# Patient Record
Sex: Male | Born: 1965 | Race: White | Hispanic: No | Marital: Single | Smoking: Former smoker
Health system: Southern US, Community
[De-identification: ages and names within clinical notes are randomized; demographics above are authoritative.]

## PROBLEM LIST (undated history)

## (undated) DIAGNOSIS — R0683 Snoring: Secondary | ICD-10-CM

## (undated) DIAGNOSIS — F40240 Claustrophobia: Secondary | ICD-10-CM

## (undated) DIAGNOSIS — D649 Anemia, unspecified: Secondary | ICD-10-CM

## (undated) DIAGNOSIS — I10 Essential (primary) hypertension: Secondary | ICD-10-CM

## (undated) DIAGNOSIS — Z87442 Personal history of urinary calculi: Secondary | ICD-10-CM

## (undated) DIAGNOSIS — I499 Cardiac arrhythmia, unspecified: Secondary | ICD-10-CM

## (undated) DIAGNOSIS — F410 Panic disorder [episodic paroxysmal anxiety] without agoraphobia: Secondary | ICD-10-CM

## (undated) DIAGNOSIS — T8859XA Other complications of anesthesia, initial encounter: Secondary | ICD-10-CM

## (undated) DIAGNOSIS — I480 Paroxysmal atrial fibrillation: Secondary | ICD-10-CM

## (undated) DIAGNOSIS — K219 Gastro-esophageal reflux disease without esophagitis: Secondary | ICD-10-CM

## (undated) DIAGNOSIS — R0789 Other chest pain: Secondary | ICD-10-CM

## (undated) HISTORY — DX: Snoring: R06.83

## (undated) HISTORY — PX: ESOPHAGOGASTRODUODENOSCOPY: SHX1529

## (undated) HISTORY — DX: Other chest pain: R07.89

## (undated) HISTORY — DX: Morbid (severe) obesity due to excess calories: E66.01

## (undated) HISTORY — DX: Paroxysmal atrial fibrillation: I48.0

---

## 1994-06-27 HISTORY — PX: NASAL SINUS SURGERY: SHX719

## 1996-06-27 HISTORY — PX: KNEE ARTHROSCOPY: SHX127

## 2008-09-08 ENCOUNTER — Ambulatory Visit (HOSPITAL_BASED_OUTPATIENT_CLINIC_OR_DEPARTMENT_OTHER): Admission: RE | Admit: 2008-09-08 | Discharge: 2008-09-08 | Payer: Self-pay | Admitting: Family Medicine

## 2008-09-13 ENCOUNTER — Ambulatory Visit: Payer: Self-pay | Admitting: Internal Medicine

## 2010-11-09 NOTE — Procedures (Signed)
NAME:  Nathaniel Mcgee, Nathaniel Mcgee NO.:  [id number]   MEDICAL RECORD NO.:  [id number]         PATIENT TYPE:  OUT   LOCATION:  SLEEP CENTER                 FACILITY:  [facility]   PHYSICIAN:  Clinton D. Maple Hudson, MD, FCCP, FACPDATE OF BIRTH:  07/13/67   DATE OF STUDY:  09/08/2008                            NOCTURNAL POLYSOMNOGRAM   REFERRING PHYSICIAN:  Jeanmarie Plant   INDICATIONS FOR STUDY:  Hypersomnia with sleep apnea.   EPWORTH SLEEPINESS SCORE:  Epworth sleepiness score 9/24.  BMI 41.1.  Weight 347 pounds.  Height 77 inches.  Neck is 19 inches.   MEDICATIONS:  Home medications are charted and reviewed.  Note, that he  describes a lifestyle with alternating shifts of work and sleep changing  within a week.  At times, he had difficulty initiating sleep and then  difficulty waking in the morning or at the end of his sleep.   SLEEP ARCHITECTURE:  Total sleep time 0.  Sleep efficiency 0.  The study  recorded at lights out at 2204 p.m. with lights on at 0:49:40 a.m.  No  sleep was obtained during that time.  The technician indicated that the  patient could not tolerate any of the routine monitor leads and  requested to end the study due to a panic attack.   IMPRESSION/RECOMMENDATIONS:  1. This patient could not tolerate any of the routine monitor leads      and ended the study with no sleep obtained after what the      technician described as a panic attack with no bedtime medication      taken.  2. No sleep was achieved.  Baseline awake room air oxygen saturation      was 97%.  3. He describes a difficult work week with a rotating shift schedule,      which is likely to create sleep difficulties for otherwise normal      people.  If desired, he can return for repeat testing bringing a      sedative hypnotic if appropriate.  Arrangements can be made for his      to come over to the sleep center and spend time with the technician      desensitizing to the monitoring environment  separately from his      sleep study.  This arrangement could be made by the daytime Sleep      Center staff.  Additionally, we can arrange for a daytime sleep      study if that would be easier and better related to his best sleep      time.      Clinton D. Maple Hudson, MD, [facility], FACP  Diplomate, Biomedical engineer of Sleep Medicine  Electronically Signed     CDY/MEDQ  D:  09/13/2008 10:03:36  T:  09/14/2008 02:58:44  Job:  [id number]

## 2011-02-03 ENCOUNTER — Other Ambulatory Visit: Payer: Self-pay | Admitting: Family Medicine

## 2011-02-03 DIAGNOSIS — R29898 Other symptoms and signs involving the musculoskeletal system: Secondary | ICD-10-CM

## 2011-02-06 ENCOUNTER — Ambulatory Visit
Admission: RE | Admit: 2011-02-06 | Discharge: 2011-02-06 | Disposition: A | Discharge status: Home / Self Care | Payer: 59 | Source: Ambulatory Visit | Attending: Family Medicine | Admitting: Family Medicine

## 2011-02-06 DIAGNOSIS — R29898 Other symptoms and signs involving the musculoskeletal system: Secondary | ICD-10-CM

## 2012-04-03 ENCOUNTER — Ambulatory Visit (INDEPENDENT_AMBULATORY_CARE_PROVIDER_SITE_OTHER): Payer: 59 | Admitting: Licensed Clinical Social Worker

## 2012-04-03 DIAGNOSIS — F411 Generalized anxiety disorder: Secondary | ICD-10-CM

## 2012-05-01 ENCOUNTER — Ambulatory Visit (INDEPENDENT_AMBULATORY_CARE_PROVIDER_SITE_OTHER): Payer: 59 | Admitting: Licensed Clinical Social Worker

## 2012-05-01 DIAGNOSIS — F411 Generalized anxiety disorder: Secondary | ICD-10-CM

## 2012-05-29 ENCOUNTER — Ambulatory Visit (INDEPENDENT_AMBULATORY_CARE_PROVIDER_SITE_OTHER): Payer: 59 | Admitting: Licensed Clinical Social Worker

## 2012-05-29 DIAGNOSIS — F411 Generalized anxiety disorder: Secondary | ICD-10-CM

## 2012-07-03 ENCOUNTER — Ambulatory Visit (INDEPENDENT_AMBULATORY_CARE_PROVIDER_SITE_OTHER): Payer: 59 | Admitting: Licensed Clinical Social Worker

## 2012-07-03 DIAGNOSIS — F411 Generalized anxiety disorder: Secondary | ICD-10-CM

## 2012-07-31 ENCOUNTER — Ambulatory Visit (INDEPENDENT_AMBULATORY_CARE_PROVIDER_SITE_OTHER): Payer: 59 | Admitting: Licensed Clinical Social Worker

## 2012-07-31 DIAGNOSIS — F411 Generalized anxiety disorder: Secondary | ICD-10-CM

## 2012-09-25 ENCOUNTER — Ambulatory Visit (INDEPENDENT_AMBULATORY_CARE_PROVIDER_SITE_OTHER): Payer: 59 | Admitting: Licensed Clinical Social Worker

## 2012-09-25 DIAGNOSIS — F411 Generalized anxiety disorder: Secondary | ICD-10-CM

## 2013-01-29 ENCOUNTER — Ambulatory Visit (INDEPENDENT_AMBULATORY_CARE_PROVIDER_SITE_OTHER): Payer: 59 | Admitting: Licensed Clinical Social Worker

## 2013-01-29 DIAGNOSIS — F411 Generalized anxiety disorder: Secondary | ICD-10-CM

## 2013-05-28 ENCOUNTER — Ambulatory Visit: Payer: 59 | Admitting: Licensed Clinical Social Worker

## 2016-04-01 ENCOUNTER — Encounter: Payer: Self-pay | Admitting: Emergency Medicine

## 2016-04-01 ENCOUNTER — Emergency Department: Payer: 59

## 2016-04-01 ENCOUNTER — Observation Stay
Admission: EM | Admit: 2016-04-01 | Discharge: 2016-04-02 | Disposition: A | Discharge status: Home / Self Care | Payer: 59 | Attending: Internal Medicine | Admitting: Internal Medicine

## 2016-04-01 DIAGNOSIS — Z7982 Long term (current) use of aspirin: Secondary | ICD-10-CM | POA: Insufficient documentation

## 2016-04-01 DIAGNOSIS — Z79899 Other long term (current) drug therapy: Secondary | ICD-10-CM | POA: Insufficient documentation

## 2016-04-01 DIAGNOSIS — D72829 Elevated white blood cell count, unspecified: Secondary | ICD-10-CM | POA: Insufficient documentation

## 2016-04-01 DIAGNOSIS — G8929 Other chronic pain: Secondary | ICD-10-CM | POA: Diagnosis not present

## 2016-04-01 DIAGNOSIS — I4891 Unspecified atrial fibrillation: Principal | ICD-10-CM | POA: Insufficient documentation

## 2016-04-01 DIAGNOSIS — R079 Chest pain, unspecified: Secondary | ICD-10-CM | POA: Diagnosis present

## 2016-04-01 DIAGNOSIS — I480 Paroxysmal atrial fibrillation: Secondary | ICD-10-CM

## 2016-04-01 DIAGNOSIS — I10 Essential (primary) hypertension: Secondary | ICD-10-CM | POA: Diagnosis not present

## 2016-04-01 DIAGNOSIS — M542 Cervicalgia: Secondary | ICD-10-CM | POA: Insufficient documentation

## 2016-04-01 DIAGNOSIS — T380X5A Adverse effect of glucocorticoids and synthetic analogues, initial encounter: Secondary | ICD-10-CM | POA: Diagnosis not present

## 2016-04-01 HISTORY — DX: Claustrophobia: F40.240

## 2016-04-01 HISTORY — DX: Panic disorder (episodic paroxysmal anxiety): F41.0

## 2016-04-01 HISTORY — DX: Essential (primary) hypertension: I10

## 2016-04-01 HISTORY — DX: Paroxysmal atrial fibrillation: I48.0

## 2016-04-01 HISTORY — DX: Gastro-esophageal reflux disease without esophagitis: K21.9

## 2016-04-01 LAB — CBC WITH DIFFERENTIAL/PLATELET
Basophils Absolute: 0.1 10*3/uL (ref 0–0.1)
Basophils Relative: 0 %
EOS ABS: 0.1 10*3/uL (ref 0–0.7)
EOS PCT: 1 %
HCT: 50.7 % (ref 40.0–52.0)
HEMOGLOBIN: 16.8 g/dL (ref 13.0–18.0)
Lymphocytes Relative: 20 %
Lymphs Abs: 4.8 10*3/uL — ABNORMAL HIGH (ref 1.0–3.6)
MCH: 28 pg (ref 26.0–34.0)
MCHC: 33.1 g/dL (ref 32.0–36.0)
MCV: 84.5 fL (ref 80.0–100.0)
MONO ABS: 2 10*3/uL — AB (ref 0.2–1.0)
MONOS PCT: 8 %
NEUTROS PCT: 71 %
Neutro Abs: 17.4 10*3/uL — ABNORMAL HIGH (ref 1.4–6.5)
Platelets: 327 10*3/uL (ref 150–440)
RBC: 6 MIL/uL — ABNORMAL HIGH (ref 4.40–5.90)
RDW: 15.1 % — AB (ref 11.5–14.5)
WBC: 24.4 10*3/uL — ABNORMAL HIGH (ref 3.8–10.6)

## 2016-04-01 LAB — PROTIME-INR
INR: 0.97
PROTHROMBIN TIME: 12.9 s (ref 11.4–15.2)

## 2016-04-01 LAB — COMPREHENSIVE METABOLIC PANEL
ALK PHOS: 72 U/L (ref 38–126)
ALT: 50 U/L (ref 17–63)
ANION GAP: 7 (ref 5–15)
AST: 30 U/L (ref 15–41)
Albumin: 4.2 g/dL (ref 3.5–5.0)
BUN: 14 mg/dL (ref 6–20)
CALCIUM: 9.5 mg/dL (ref 8.9–10.3)
CO2: 24 mmol/L (ref 22–32)
Chloride: 107 mmol/L (ref 101–111)
Creatinine, Ser: 0.91 mg/dL (ref 0.61–1.24)
GFR calc non Af Amer: >60 mL/min (ref >60)
Glucose, Bld: 120 mg/dL — ABNORMAL HIGH (ref 65–99)
POTASSIUM: 3.8 mmol/L (ref 3.5–5.1)
SODIUM: 138 mmol/L (ref 135–145)
TOTAL PROTEIN: 7.6 g/dL (ref 6.5–8.1)
Total Bilirubin: 0.7 mg/dL (ref 0.3–1.2)

## 2016-04-01 LAB — TSH: TSH: 1.859 u[IU]/mL (ref 0.350–4.500)

## 2016-04-01 LAB — APTT: aPTT: 32 seconds (ref 24–36)

## 2016-04-01 LAB — TROPONIN I
Troponin I: <0.03 ng/mL (ref <0.03)
Troponin I: <0.03 ng/mL (ref <0.03)

## 2016-04-01 LAB — T4, FREE: FREE T4: 0.98 ng/dL (ref 0.61–1.12)

## 2016-04-01 MED ORDER — NICOTINE 14 MG/24HR TD PT24
14.0000 mg | MEDICATED_PATCH | Freq: Every day | TRANSDERMAL | Status: DC
  Administered 2016-04-01: 14 mg via TRANSDERMAL
  Filled 2016-04-01: qty 1

## 2016-04-01 MED ORDER — ACETAMINOPHEN 325 MG PO TABS: 650.0000 mg | ORAL_TABLET | Freq: Four times a day (QID) | ORAL | Status: DC | PRN

## 2016-04-01 MED ORDER — SODIUM CHLORIDE 0.9 % IV SOLN: 250.0000 mL | INTRAVENOUS | Status: DC | PRN

## 2016-04-01 MED ORDER — SODIUM CHLORIDE 0.9% FLUSH
3.0000 mL | Freq: Two times a day (BID) | INTRAVENOUS | Status: DC
  Administered 2016-04-01 – 2016-04-02 (×2): 3 mL via INTRAVENOUS

## 2016-04-01 MED ORDER — ACETAMINOPHEN 650 MG RE SUPP: 650.0000 mg | Freq: Four times a day (QID) | RECTAL | Status: DC | PRN

## 2016-04-01 MED ORDER — DILTIAZEM HCL 25 MG/5ML IV SOLN
INTRAVENOUS | Status: AC
  Administered 2016-04-01: 15 mg via INTRAVENOUS
  Filled 2016-04-01: qty 5

## 2016-04-01 MED ORDER — ALBUTEROL SULFATE (2.5 MG/3ML) 0.083% IN NEBU: 2.5000 mg | INHALATION_SOLUTION | RESPIRATORY_TRACT | Status: DC | PRN

## 2016-04-01 MED ORDER — ENOXAPARIN SODIUM 40 MG/0.4ML ~~LOC~~ SOLN
40.0000 mg | SUBCUTANEOUS | Status: DC
  Administered 2016-04-01: 40 mg via SUBCUTANEOUS

## 2016-04-01 MED ORDER — DILTIAZEM HCL 30 MG PO TABS
30.0000 mg | ORAL_TABLET | Freq: Once | ORAL | Status: AC
  Administered 2016-04-01: 30 mg via ORAL
  Filled 2016-04-01: qty 1

## 2016-04-01 MED ORDER — SODIUM CHLORIDE 0.9% FLUSH: 3.0000 mL | INTRAVENOUS | Status: DC | PRN

## 2016-04-01 MED ORDER — IBUPROFEN 400 MG PO TABS
400.0000 mg | ORAL_TABLET | Freq: Four times a day (QID) | ORAL | Status: DC | PRN
Start: 2016-04-01 — End: 2016-04-02
  Administered 2016-04-02: 400 mg via ORAL
  Filled 2016-04-01: qty 1

## 2016-04-01 MED ORDER — SODIUM CHLORIDE 0.9 % IV BOLUS (SEPSIS)
1000.0000 mL | Freq: Once | INTRAVENOUS | Status: AC
  Administered 2016-04-01: 1000 mL via INTRAVENOUS

## 2016-04-01 MED ORDER — DILTIAZEM HCL 25 MG/5ML IV SOLN
5.0000 mg | Freq: Once | INTRAVENOUS | Status: AC
  Administered 2016-04-01: 5 mg via INTRAVENOUS

## 2016-04-01 MED ORDER — APIXABAN 5 MG PO TABS
5.0000 mg | ORAL_TABLET | Freq: Two times a day (BID) | ORAL | Status: DC
  Administered 2016-04-02: 5 mg via ORAL
  Filled 2016-04-01: qty 1

## 2016-04-01 MED ORDER — ADENOSINE 12 MG/4ML IV SOLN
INTRAVENOUS | Status: AC
  Filled 2016-04-01: qty 4

## 2016-04-01 MED ORDER — METOPROLOL TARTRATE 5 MG/5ML IV SOLN: 5.0000 mg | INTRAVENOUS | Status: DC | PRN

## 2016-04-01 MED ORDER — METOPROLOL TARTRATE 50 MG PO TABS
50.0000 mg | ORAL_TABLET | Freq: Two times a day (BID) | ORAL | Status: DC
  Administered 2016-04-01 – 2016-04-02 (×2): 50 mg via ORAL
  Filled 2016-04-01: qty 1

## 2016-04-01 MED ORDER — DILTIAZEM HCL 25 MG/5ML IV SOLN
15.0000 mg | Freq: Once | INTRAVENOUS | Status: AC
  Administered 2016-04-01: 15 mg via INTRAVENOUS

## 2016-04-01 MED ORDER — ADENOSINE 6 MG/2ML IV SOLN
INTRAVENOUS | Status: AC
  Filled 2016-04-01: qty 2

## 2016-04-01 NOTE — ED Notes (Signed)
Pt denies needs, explanation of admission process explained to pt who verbalizes understanding. Call bell provided. Mother at bedside.

## 2016-04-01 NOTE — Progress Notes (Signed)
ANTICOAGULATION CONSULT NOTE - Initial Consult  Pharmacy Consult for Apixaban  Indication: atrial fibrillation  Allergies  Allergen Reactions  . Sulfa Antibiotics Other (See Comments)    "feels like my bones were burning"    Patient Measurements: Height: 6\' 5"  (195.6 cm) Weight: (!) 359 lb (162.8 kg) IBW/kg (Calculated) : 89.1 Heparin Dosing Weight:   Vital Signs: BP: 115/79 (10/06 2018) Pulse Rate: 97 (10/06 2018)  Labs:  Recent Labs  04/01/16 1729  HGB 16.8  HCT 50.7  PLT 327  APTT 32  LABPROT 12.9  INR 0.97  CREATININE 0.91  TROPONINI <0.03    Estimated Creatinine Clearance: 166.5 mL/min (by C-G formula based on SCr of 0.91 mg/dL).   Medical History: Past Medical History:  Diagnosis Date  . Claustrophobia   . GERD (gastroesophageal reflux disease)   . Hypertension   . Panic attacks     Medications:   (Not in a hospital admission)  Assessment: Pharmacy consulted to dose Eliquis in this 50 year old male with Afib. TBW = 162.8 kg  SrCr = 0.98  Goal of Therapy:  prevention of thrombo embolism   Plan:  Pt received Lovenox 40 mg SQ on 10/06 @ 20:00.  Eliquis 5 mg PO BID ordered to start on 10/07 @ 10:00.   Mckynzi Cammon D 04/01/2016,8:41 PM

## 2016-04-01 NOTE — Progress Notes (Signed)
Patient is admitted to room 238 with the diagnosis of Afib. Alert and oriented x 4. Denied any acute chest pain right now. Tele box called to CCMD ( box 04 ) with Andria Meuse RN as a second  verified. Skin assessment done with Andria Meuse RN as well, no skin issues of concern ( intact skin) noted. Mother is at the bedside voiced no concerns. Patient requested for Nicotine patch. Dr. Jannifer Franklin notified and received new order and will be implemented as soon as it becomes available. Will continue to monitor.

## 2016-04-01 NOTE — ED Notes (Signed)
Pt updated on admission process and room assignment. Pt verbalizes understanding.

## 2016-04-01 NOTE — ED Notes (Signed)
Mother's number [phone]

## 2016-04-01 NOTE — H&P (Signed)
[place] [facility] [place] NAME: Nathaniel Mcgee    MR#:  [id number]  DATE OF BIRTH:  11-Aug-1965  DATE OF ADMISSION:  04/01/2016  PRIMARY CARE PHYSICIAN: No primary care provider on file.   REQUESTING/REFERRING PHYSICIAN: Dr. Edd Fabian  CHIEF COMPLAINT:   Chief Complaint  Patient presents with  . Chest Pain    HISTORY OF PRESENT ILLNESS:  Nathaniel Mcgee  is a 50 y.o. male with a known history of Back pain, neck pain presents to the emergency room complaining of chest pain. He has been found to be in atrial fibrillation with rapid ventricular rate. Has been on prednisone for back pain and white blood cell count is elevated at 24.5 thousand. Patient has chronic neck pain radiating to chest thought to be due to degenerative disc disease of the neck. He has had recurrent ER visits for the same. Today he presented with similar pain but was found to be in atrial fibrillation. He has received 2 doses of IV Cardizem and oral Cardizem with heart rate still 110-125. He is being admitted to the hospitalist service.  PAST MEDICAL HISTORY:   Past Medical History:  Diagnosis Date  . Claustrophobia   . GERD (gastroesophageal reflux disease)   . Hypertension   . Panic attacks     PAST SURGICAL HISTORY:  No past surgical history on file.  SOCIAL HISTORY:   Social History  Substance Use Topics  . Smoking status: Not on file  . Smokeless tobacco: Not on file  . Alcohol use Not on file    FAMILY HISTORY:  No family history on file.  DRUG ALLERGIES:   Allergies  Allergen Reactions  . Sulfa Antibiotics Other (See Comments)    "feels like my bones were burning"    REVIEW OF SYSTEMS:   Review of Systems  Constitutional: Positive for malaise/fatigue. Negative for chills, fever and weight loss.  HENT: Negative for hearing loss and nosebleeds.   Eyes: Negative for blurred vision, double vision and pain.  Respiratory: Negative for cough, hemoptysis,  sputum production, shortness of breath and wheezing.   Cardiovascular: Positive for palpitations. Negative for chest pain, orthopnea and leg swelling.  Gastrointestinal: Negative for abdominal pain, constipation, diarrhea, nausea and vomiting.  Genitourinary: Negative for dysuria and hematuria.  Musculoskeletal: Positive for back pain and neck pain. Negative for falls and myalgias.  Skin: Negative for rash.  Neurological: Negative for dizziness, tremors, sensory change, speech change, focal weakness, seizures and headaches.  Endo/Heme/Allergies: Does not bruise/bleed easily.  Psychiatric/Behavioral: Negative for depression and memory loss. The patient is not nervous/anxious.     MEDICATIONS AT HOME:   Prior to Admission medications   Medication Sig Start Date End Date Taking? Authorizing Provider  aspirin EC 81 MG tablet Take 324 mg by mouth daily.   Yes Historical Provider, MD  chlorzoxazone (PARAFON) 500 MG tablet Take 1 tablet by mouth 3 (three) times daily. 03/25/16  Yes Historical Provider, MD  Multiple Vitamin (MULTIVITAMIN) tablet Take 1 tablet by mouth daily.   Yes Historical Provider, MD  predniSONE (STERAPRED UNI-PAK 21 TAB) 10 MG (21) TBPK tablet Take 10-60 mg by mouth daily. 6,5,4,3,2,1 Finished today 04/01/2016     Historical Provider, MD     VITAL SIGNS:  Blood pressure (!) 134/98, pulse 100, resp. rate 20, height 6\' 5"  (1.956 m), weight (!) 162.8 kg (359 lb), SpO2 94 %.  PHYSICAL EXAMINATION:  Physical Exam  GENERAL:  50 y.o.-year-old patient lying in  the bed with no acute distress. Obese EYES: Pupils equal, round, reactive to light and accommodation. No scleral icterus. Extraocular muscles intact.  HEENT: Head atraumatic, normocephalic. Oropharynx and nasopharynx clear. No oropharyngeal erythema, moist oral mucosa  NECK:  Supple, no jugular venous distention. No thyroid enlargement, no tenderness.  LUNGS: Normal breath sounds bilaterally, no wheezing, rales, rhonchi. No  use of accessory muscles of respiration.  CARDIOVASCULAR: S1, S2 irregular and tachycardic. No murmurs, rubs, or gallops.  ABDOMEN: Soft, nontender, nondistended. Bowel sounds present. No organomegaly or mass.  EXTREMITIES: No pedal edema, cyanosis, or clubbing. + 2 pedal & radial pulses b/l.   NEUROLOGIC: Cranial nerves II through XII are intact. No focal Motor or sensory deficits appreciated b/l PSYCHIATRIC: The patient is alert and oriented x 3. Good affect.  SKIN: No obvious rash, lesion, or ulcer.   LABORATORY PANEL:   CBC  Recent Labs Lab 04/01/16 1729  WBC 24.4*  HGB 16.8  HCT 50.7  PLT 327   ------------------------------------------------------------------------------------------------------------------  Chemistries   Recent Labs Lab 04/01/16 1729  NA 138  K 3.8  CL 107  CO2 24  GLUCOSE 120*  BUN 14  CREATININE 0.91  CALCIUM 9.5  AST 30  ALT 50  ALKPHOS 72  BILITOT 0.7   ------------------------------------------------------------------------------------------------------------------  Cardiac Enzymes  Recent Labs Lab 04/01/16 1729  TROPONINI <0.03   ------------------------------------------------------------------------------------------------------------------  RADIOLOGY:  Dg Chest Portable 1 View  Result Date: 04/01/2016 CLINICAL DATA:  Chest pain 1 week ago.  Dizziness and confusion. EXAM: PORTABLE CHEST 1 VIEW COMPARISON:  None. FINDINGS: The heart size and mediastinal contours are within normal limits. Both lungs are clear. The visualized skeletal structures are unremarkable. IMPRESSION: No active disease. Electronically Signed   By: Lucienne Capers M.D.   On: 04/01/2016 18:09     IMPRESSION AND PLAN:   *  Atrial fibrillation with rapid ventricular rate Heart rate is still close to 120 after IV and oral Cardizem. In atrial fibrillation. Start oral metoprolol. IV metoprolol as needed. Start Eliquis. Check echocardiogram. Consult  cardiology. TSH normal.  * Chronic neck pain is same. Ibuprofen as needed.  * Leukocytosis due to recent prednisone use    All the records are reviewed and case discussed with ED provider. Management plans discussed with the patient, family and they are in agreement.  CODE STATUS: FULL CODE  TOTAL TIME TAKING CARE OF THIS PATIENT: 40 minutes.   Hillary Bow R M.D on 04/01/2016 at 7:50 PM  Between 7am to 6pm - Pager - [phone]  After 6pm go to [email or web address] - password [email or web address] [facility]  [place] [facility]  Office  [phone]  CC: Primary care physician; No primary care provider on file.  Note: This dictation was prepared with Dragon dictation along with smaller phrase technology. Any transcriptional errors that result from this process are unintentional.

## 2016-04-01 NOTE — ED Triage Notes (Signed)
Patient states that he started having chest pain one week ago.  Palpitations began last night. Patient thought it was his esophagus due to history of GERD.  Takes pepcid.  Patient feeling dizzy, confused, diaphoretic, and heart palpitations.

## 2016-04-01 NOTE — ED Notes (Signed)
Informed Rn bed ready

## 2016-04-01 NOTE — ED Provider Notes (Signed)
[facility] Emergency Department Provider Note   ____________________________________________   First MD Initiated Contact with Patient 04/01/16 1732     (approximate)  I have reviewed the triage vital signs and the nursing notes.   HISTORY  Chief Complaint Chest Pain    HPI Nathaniel Mcgee is a 50 y.o. male history anxiety, panic attacks, GERD, hypertension, scleroderma who presents from urgent care for chest pain as well as elevated heart rate. Patient reports that he has had intermittent chest pain over the past week, usually gradual onset, sometimes radiating into the elbows bilaterally, usually moderate, now resolved. He reports that the last time he had this chest pain was one week ago. He has had intermittent palpitations where he feels as if his heart is beating quite quickly. Had mild shortness of breath intermittently. No vomiting, diarrhea, fevers or chills. He was found to have an elevated heart rate in the 160s at urgent care and was referred to the emergency department.   Past Medical History:  Diagnosis Date  . Claustrophobia   . GERD (gastroesophageal reflux disease)   . Hypertension   . Panic attacks     There are no active problems to display for this patient.   No past surgical history on file.  Prior to Admission medications   Not on File    Allergies Review of patient's allergies indicates not on file.  No family history on file.  Social History Social History  Substance Use Topics  . Smoking status: Not on file  . Smokeless tobacco: Not on file  . Alcohol use Not on file    Review of Systems Constitutional: No fever/chills Eyes: No visual changes. ENT: No sore throat. Cardiovascular: + chest pain. Respiratory: Denies shortness of breath. Gastrointestinal: No abdominal pain.  No nausea, no vomiting.  No diarrhea.  No constipation. Genitourinary: Negative for dysuria. Musculoskeletal: Negative for back  pain. Skin: Negative for rash. Neurological: Negative for headaches, focal weakness or numbness.  10-point ROS otherwise negative.  ____________________________________________   PHYSICAL EXAM:  Vitals:   04/01/16 1727 04/01/16 1730 04/01/16 1800 04/01/16 1815  BP:  117/83 113/79 (!) 131/92  Pulse:  69 99 (!) 101  Resp:  16 20 (!) 30  SpO2:  95% 97% 95%  Weight: (!) 359 lb (162.8 kg)     Height:        VITAL SIGNS: ED Triage Vitals  Enc Vitals Group     BP 04/01/16 1724 (!) 147/78     Pulse Rate 04/01/16 1724 82     Resp 04/01/16 1724 16     Temp --      Temp src --      SpO2 04/01/16 1724 98 %     Weight 04/01/16 1721 255 lb (115.7 kg)     Height 04/01/16 1721 6\' 5"  (1.956 m)     Head Circumference --      Peak Flow --      Pain Score 04/01/16 1721 4     Pain Loc --      Pain Edu? --      Excl. in [place]? --     Constitutional: Alert and oriented. Well appearing and in no acute distress. Eyes: Conjunctivae are normal. PERRL. EOMI. Head: Atraumatic. Nose: No congestion/rhinnorhea. Mouth/Throat: Mucous membranes are moist.  Oropharynx non-erythematous. Neck: No stridor.   Cardiovascular: Tachycardic rate, irregular rhythm. Grossly normal heart sounds.  Good peripheral circulation. Respiratory: Normal respiratory effort.  No retractions. Lungs  CTAB. Gastrointestinal: Soft and nontender. No distention. No CVA tenderness. Genitourinary: deferred Musculoskeletal: No lower extremity tenderness nor edema.  No joint effusions. Neurologic:  Normal speech and language. No gross focal neurologic deficits are appreciated. No gait instability. Skin:  Skin is warm, dry and intact. No rash noted. Psychiatric: Mood and affect are normal. Speech and behavior are normal.  ____________________________________________   LABS (all labs ordered are listed, but only abnormal results are displayed)  Labs Reviewed  CBC WITH DIFFERENTIAL/PLATELET - Abnormal; Notable for the following:        Result Value   WBC 24.4 (*)    RBC 6.00 (*)    RDW 15.1 (*)    Neutro Abs 17.4 (*)    Lymphs Abs 4.8 (*)    Monocytes Absolute 2.0 (*)    All other components within normal limits  COMPREHENSIVE METABOLIC PANEL - Abnormal; Notable for the following:    Glucose, Bld 120 (*)    All other components within normal limits  TROPONIN I  PROTIME-INR  APTT  TSH  T4, FREE   ____________________________________________  EKG  ED ECG REPORT I, Joanne Gavel, the attending physician, personally viewed and interpreted this ECG.   Date: 04/01/2016  EKG Time: 17:23  Rate: 165  Rhythm: atrial fibrillation, rate 165  Axis: normal  Intervals:none  ST&T Change: No acute ST elevation or acute ST depression.  ____________________________________________  RADIOLOGY  CXR IMPRESSION:  No active disease.      ____________________________________________   PROCEDURES  Procedure(s) performed: None  Procedures  Critical Care performed: Yes, see critical care note(s).  CRITICAL CARE Performed by: Loura Pardon A   Total critical care time: 35 minutes  Critical care time was exclusive of separately billable procedures and treating other patients.  Critical care was necessary to treat or prevent imminent or life-threatening deterioration.  Critical care was time spent personally by me on the following activities: development of treatment plan with patient and/or surrogate as well as nursing, discussions with consultants, evaluation of patient's response to treatment, examination of patient, obtaining history from patient or surrogate, ordering and performing treatments and interventions, ordering and review of laboratory studies, ordering and review of radiographic studies, pulse oximetry and re-evaluation of patient's condition.  ____________________________________________   INITIAL IMPRESSION / ASSESSMENT AND PLAN / ED COURSE  Pertinent labs & imaging results that were  available during my care of the patient were reviewed by me and considered in my medical decision making (see chart for details).  Nathaniel Mcgee is a 50 y.o. male history anxiety, panic attacks, GERD, hypertension, scleroderma who presents from urgent care for chest pain as well as elevated heart rate. Currently his chest pain is resolved. On arrival to the emergency department his initial heart rate was 165-190 bpm, EKG confirmed new-onset atrial fibrillation with a rapid ventricular rate. He received 15 mg of IV Cardizem with improvement of his heart rate to the 90s/low 100s. Will give 30 mg PO cardizem as well. He is maintaining adequate blood pressure. He is being observed on continuous telemetry monitor. We'll obtain screening labs, chest x-ray and this anticipate admission.  ----------------------------------------- 6:46 PM on 04/01/2016 ----------------------------------------- Heart rate currently 120 bpm, we'll give an additional 5 mg of IV Cardizem, patient continues to appear well. Labs are notable for leukocytosis, white blood count count is significantly elevated however patient denies any infectious complaints and I think that this may be catecholamine induced in the setting of his new A. fib with RVR.  His initial troponin is negative. CMP generally unremarkable. Case discussed with hospitalist for admission.  Clinical Course     ____________________________________________   FINAL CLINICAL IMPRESSION(S) / ED DIAGNOSES  Final diagnoses:  Chest pain, unspecified type  Atrial fibrillation with RVR (HCC)      NEW MEDICATIONS STARTED DURING THIS VISIT:  New Prescriptions   No medications on file     Note:  This document was prepared using Dragon voice recognition software and may include unintentional dictation errors.    Joanne Gavel, MD 04/01/16 [phone]

## 2016-04-01 NOTE — ED Notes (Signed)
Report from [place], [place].

## 2016-04-02 ENCOUNTER — Observation Stay (HOSPITAL_BASED_OUTPATIENT_CLINIC_OR_DEPARTMENT_OTHER)
Admit: 2016-04-02 | Discharge: 2016-04-02 | Disposition: A | Discharge status: Home / Self Care | Payer: 59 | Attending: Internal Medicine | Admitting: Internal Medicine

## 2016-04-02 DIAGNOSIS — I4891 Unspecified atrial fibrillation: Secondary | ICD-10-CM | POA: Diagnosis not present

## 2016-04-02 LAB — TROPONIN I

## 2016-04-02 LAB — ECHOCARDIOGRAM COMPLETE
HEIGHTINCHES: 77 in
WEIGHTICAEL: 5620.8 [oz_av]

## 2016-04-02 MED ORDER — APIXABAN 5 MG PO TABS: 5.0000 mg | ORAL_TABLET | Freq: Two times a day (BID) | ORAL | 1 refills | Status: DC

## 2016-04-02 MED ORDER — PERFLUTREN LIPID MICROSPHERE
1.0000 mL | INTRAVENOUS | Status: AC | PRN
  Filled 2016-04-02: qty 10

## 2016-04-02 MED ORDER — PERFLUTREN LIPID MICROSPHERE
1.0000 mL | INTRAVENOUS | Status: AC | PRN
  Administered 2016-04-02: 3 mL via INTRAVENOUS
  Filled 2016-04-02: qty 10

## 2016-04-02 MED ORDER — METOPROLOL TARTRATE 50 MG PO TABS: 50.0000 mg | ORAL_TABLET | Freq: Two times a day (BID) | ORAL | 1 refills | Status: DC

## 2016-04-02 NOTE — Progress Notes (Signed)
*  PRELIMINARY RESULTS* Echocardiogram 2D Echocardiogram has been performed. Definity Contrast used on this Echo.  Nathaniel Mcgee Stills 04/02/2016, 8:35 AM

## 2016-04-02 NOTE — Care Management Note (Signed)
Case Management Note  Patient Details  Name: Nathaniel Mcgee MRN: [id number] Date of Birth: [birth year]  Subjective/Objective:         [name] was provided with a discount coupon for Eliquis.            Action/Plan:   Expected Discharge Date:                  Expected Discharge Plan:     In-House Referral:     Discharge planning Services     Post Acute Care Choice:    Choice offered to:     DME Arranged:    DME Agency:     HH Arranged:    HH Agency:     Status of Service:     If discussed at [employer] of Stay Meetings, dates discussed:    Additional Comments:  Banks Chaikin A, RN 04/02/2016, 1:24 PM

## 2016-04-02 NOTE — Discharge Instructions (Signed)
Angina Pectoris  Angina pectoris, often called angina, is extreme discomfort in the chest, neck, or arm. This is caused by a lack of blood in the middle and thickest layer of the heart wall (myocardium). There are four types of angina:  · Stable angina. Stable angina usually occurs in episodes of predictable frequency and duration. It is usually brought on by physical activity, stress, or excitement. Stable angina usually lasts a few minutes and can often be relieved by a medicine that you place under your tongue. This medicine is called sublingual nitroglycerin.  · Unstable angina. Unstable angina can occur even when you are doing little or no physical activity. It can even occur while you are sleeping or when you are at rest. It can suddenly increase in severity or frequency. It may not be relieved by sublingual nitroglycerin, and it can last up to 30 minutes.  · Microvascular angina. This type of angina is caused by a disorder of tiny blood vessels called arterioles. Microvascular angina is more common in women. The pain may be more severe and last longer than other types of angina pectoris.  · Prinzmetal or variant angina. This type of angina pectoris is rare and usually occurs when you are doing little or no physical activity. It especially occurs in the early morning hours.  CAUSES  Atherosclerosis is the cause of angina. This is the buildup of fat and cholesterol (plaque) on the inside of the arteries. Over time, the plaque may narrow or block the artery, and this will lessen blood flow to the heart. Plaque can also become weak and break off within a coronary artery to form a clot and cause a sudden blockage.  RISK FACTORS  Risk factors common to both men and women include:  · High cholesterol levels.  · High blood pressure (hypertension).  · Tobacco use.  · Diabetes.  · Family history of angina.  · Obesity.  · Lack of exercise.  · A diet high in saturated fats.  Women are at greater risk for angina if they  are:  · Over age 55.  · Postmenopausal.  SYMPTOMS  Many people do not experience any symptoms during the early stages of angina. As the condition progresses, symptoms common to both men and women may include:  · Chest pain.    The pain can be described as a crushing or squeezing in the chest, or a tightness, pressure, fullness, or heaviness in the chest.    The pain can last more than a few minutes, or it can stop and recur.  · Pain in the arms, neck, jaw, or back.  · Unexplained heartburn or indigestion.  · Shortness of breath.  · Nausea.  · Sudden cold sweats.  · Sudden light-headedness.  Many women have chest discomfort and some of the other symptoms. However, women often have different (atypical) symptoms, such as:   · Fatigue.  · Unexplained feelings of nervousness or anxiety.  · Unexplained weakness.  · Dizziness or fainting.  Sometimes, women may have angina without any symptoms.  DIAGNOSIS   Tests to diagnose angina may include:  · ECG (electrocardiogram).  · Exercise stress test. This looks for signs of blockage when the heart is being exercised.  · Pharmacologic stress test. This test looks for signs of blockage when the heart is being stressed with a medicine.  · Blood tests.  · Coronary angiogram. This is a procedure to look at the coronary arteries to see if there is any blockage.    TREATMENT   The treatment of angina may include the following:  · Healthy behavioral changes to reduce or control risk factors.  · Medicine.  · Coronary stenting. A stent helps to keep an artery open.  · Coronary angioplasty. This procedure widens a narrowed or blocked artery.  · Coronary artery bypass surgery. This will allow your blood to pass the blockage (bypass) to reach your heart.  HOME CARE INSTRUCTIONS   · Take medicines only as directed by your health care provider.  · Do not take the following medicines unless your health care provider approves:    Nonsteroidal anti-inflammatory drugs (NSAIDs), such as ibuprofen,  naproxen, or celecoxib.    Vitamin supplements that contain vitamin A, vitamin E, or both.    Hormone replacement therapy that contains estrogen with or without progestin.  · Manage other health conditions such as hypertension and diabetes as directed by your health care provider.  · Follow a heart-healthy diet. A dietitian can help to educate you about healthy food options and changes.  · Use healthy cooking methods such as roasting, grilling, broiling, baking, poaching, steaming, or stir-frying. Talk to a dietitian to learn more about healthy cooking methods.  · Follow an exercise program approved by your health care provider.  · Maintain a healthy weight. Lose weight as approved by your health care provider.  · Plan rest periods when fatigued.  · Learn to manage stress.  · Do not use any tobacco products, including cigarettes, chewing tobacco, or electronic cigarettes. If you need help quitting, ask your health care provider.  · If you drink alcohol, and your health care provider approves, limit your alcohol intake to no more than 1 drink per day. One drink equals 12 ounces of beer, 5 ounces of wine, or 1½ ounces of hard liquor.  · Stop illegal drug use.  · Keep all follow-up visits as directed by your health care provider. This is important.  SEEK IMMEDIATE MEDICAL CARE IF:   · You have pain in your chest, neck, arm, jaw, stomach, or back that lasts more than a few minutes, is recurring, or is unrelieved by taking sublingual nitroglycerin.  · You have profuse sweating without cause.  · You have unexplained:    Heartburn or indigestion.    Shortness of breath or difficulty breathing.    Nausea or vomiting.    Fatigue.    Feelings of nervousness or anxiety.    Weakness.    Diarrhea.  · You have sudden light-headedness or dizziness.  · You faint.  These symptoms may represent a serious problem that is an emergency. Do not wait to see if the symptoms will go away. Get medical help right away. Call your local  emergency services (911 in the [place] Do not drive yourself to the hospital.     This information is not intended to replace advice given to you by your health care provider. Make sure you discuss any questions you have with your health care provider.     Document Released: 06/13/2005 Document Revised: 07/04/2014 Document Reviewed: 10/15/2013  Elsevier Interactive Patient Education ©2016 [employer]

## 2016-04-02 NOTE — Discharge Summary (Signed)
[facility] NAME: Nathaniel Mcgee    MR#:  [id number]  DATE OF BIRTH:  [birth year]  DATE OF ADMISSION:  04/01/2016 ADMITTING PHYSICIAN: Hillary Bow, MD  DATE OF DISCHARGE: 04/02/16  PRIMARY CARE PHYSICIAN: No primary care provider on file.    ADMISSION DIAGNOSIS:  Atrial fibrillation with RVR (HCC) [I48.91] Chest pain, unspecified type [R07.9]  DISCHARGE DIAGNOSIS:  New onset atrial afibrillation Chronic Back/neck pain  SECONDARY DIAGNOSIS:   Past Medical History:  Diagnosis Date  . Claustrophobia   . GERD (gastroesophageal reflux disease)   . Hypertension   . Panic attacks     HOSPITAL COURSE:  Nathaniel Mcgee  is a 50 y.o. male with a known history of Back pain, neck pain presents to the emergency room complaining of chest pain. He has been found to be in atrial fibrillation with rapid ventricular rate. Has been on prednisone for back pain and white blood cell count is elevated at 24.5 thousand  *Atrial fibrillation with rapid ventricular rate Heart rate was close to 120 after IV cardizem oral metoprolol Now on oral Eliquis. -echocardiogram done results pending Consult cardiology spoke with dr Johnsie Cancel. He is ok for pt to go home and he will arrange for out pt f/u. -I have sent message with pt's info via EPIC messaging TSH normal.  * Chronic neck pain is same. Ibuprofen as needed.  * Leukocytosis due to recent prednisone use  *suspected OSA Pt was not able to tolerate sleep study in the past.  Overall stable. D/c home  CONSULTS OBTAINED:  Treatment Team:  Josue Hector, MD  DRUG ALLERGIES:   Allergies  Allergen Reactions  . Sulfa Antibiotics Other (See Comments)    "feels like my bones were burning"    DISCHARGE MEDICATIONS:   Current Discharge Medication List    START taking these medications   Details  apixaban (ELIQUIS) 5 MG TABS tablet Take 1 tablet (5 mg total) by mouth 2 (two) times  daily. Qty: 60 tablet, Refills: 1    metoprolol (LOPRESSOR) 50 MG tablet Take 1 tablet (50 mg total) by mouth 2 (two) times daily. Qty: 60 tablet, Refills: 1      CONTINUE these medications which have NOT CHANGED   Details  aspirin EC 81 MG tablet Take 324 mg by mouth daily.    chlorzoxazone (PARAFON) 500 MG tablet Take 1 tablet by mouth 3 (three) times daily.    Multiple Vitamin (MULTIVITAMIN) tablet Take 1 tablet by mouth daily.    predniSONE (STERAPRED UNI-PAK 21 TAB) 10 MG (21) TBPK tablet Take 10-60 mg by mouth daily. 6,5,4,3,2,1 Finished today 04/01/2016         If you experience worsening of your admission symptoms, develop shortness of breath, life threatening emergency, suicidal or homicidal thoughts you must seek medical attention immediately by calling 911 or calling your MD immediately  if symptoms less severe.  You Must read complete instructions/literature along with all the possible adverse reactions/side effects for all the Medicines you take and that have been prescribed to you. Take any new Medicines after you have completely understood and accept all the possible adverse reactions/side effects.   Please note  You were cared for by a hospitalist during your hospital stay. If you have any questions about your discharge medications or the care you received while you were in the hospital after you are discharged, you can call the unit and asked to speak with the hospitalist  on call if the hospitalist that took care of you is not available. Once you are discharged, your primary care physician will handle any further medical issues. Please note that NO REFILLS for any discharge medications will be authorized once you are discharged, as it is imperative that you return to your primary care physician (or establish a relationship with a primary care physician if you do not have one) for your aftercare needs so that they can reassess your need for medications and monitor your lab  values. Today   SUBJECTIVE   I feel much better today  VITAL SIGNS:  Blood pressure 114/64, pulse 61, temperature 98.1 F (36.7 C), temperature source Oral, resp. rate (!) 22, height 6\' 5"  (1.956 m), weight (!) 159.3 kg (351 lb 4.8 oz), SpO2 95 %.  I/O:   Intake/Output Summary (Last 24 hours) at 04/02/16 1249 Last data filed at 04/02/16 1112  Gross per 24 hour  Intake             1450 ml  Output              700 ml  Net              750 ml    PHYSICAL EXAMINATION:  GENERAL:  49 y.o.-year-old patient lying in the bed with no acute distress.  EYES: Pupils equal, round, reactive to light and accommodation. No scleral icterus. Extraocular muscles intact.  HEENT: Head atraumatic, normocephalic. Oropharynx and nasopharynx clear.  NECK:  Supple, no jugular venous distention. No thyroid enlargement, no tenderness.  LUNGS: Normal breath sounds bilaterally, no wheezing, rales,rhonchi or crepitation. No use of accessory muscles of respiration.  CARDIOVASCULAR: S1, S2 normal. No murmurs, rubs, or gallops.  ABDOMEN: Soft, non-tender, non-distended. Bowel sounds present. No organomegaly or mass.  EXTREMITIES: No pedal edema, cyanosis, or clubbing.  NEUROLOGIC: Cranial nerves II through XII are intact. Muscle strength 5/5 in all extremities. Sensation intact. Gait not checked.  PSYCHIATRIC: The patient is alert and oriented x 3.  SKIN: No obvious rash, lesion, or ulcer.   DATA REVIEW:   CBC   Recent Labs Lab 04/01/16 1729  WBC 24.4*  HGB 16.8  HCT 50.7  PLT 327    Chemistries   Recent Labs Lab 04/01/16 1729  NA 138  K 3.8  CL 107  CO2 24  GLUCOSE 120*  BUN 14  CREATININE 0.91  CALCIUM 9.5  AST 30  ALT 50  ALKPHOS 72  BILITOT 0.7    Microbiology Results   No results found for this or any previous visit (from the past 240 hour(s)).  RADIOLOGY:  Dg Chest Portable 1 View  Result Date: 04/01/2016 CLINICAL DATA:  Chest pain 1 week ago.  Dizziness and confusion.  EXAM: PORTABLE CHEST 1 VIEW COMPARISON:  None. FINDINGS: The heart size and mediastinal contours are within normal limits. Both lungs are clear. The visualized skeletal structures are unremarkable. IMPRESSION: No active disease. Electronically Signed   By: Lucienne Capers M.D.   On: 04/01/2016 18:09     Management plans discussed with the patient, family and they are in agreement.  CODE STATUS:     Code Status Orders        Start     Ordered   04/01/16 1948  Full code  Continuous     04/01/16 1949    Code Status History    Date Active Date Inactive Code Status Order ID Comments User Context   This patient has a current  code status but no historical code status.      TOTAL TIME TAKING CARE OF THIS PATIENT: 40 minutes.    Caprice Mccaffrey M.D on 04/02/2016 at 12:49 PM  Between 7am to 6pm - Pager - [phone] After 6pm go to [email or web address] - password [email or web address] [facility]  [facility]  Office  [phone]  CC: Primary care physician; No primary care provider on file.

## 2016-04-04 ENCOUNTER — Telehealth: Payer: Self-pay | Admitting: Internal Medicine

## 2016-04-04 NOTE — Telephone Encounter (Signed)
Scheduled for [facility] visit with Dr. Saunders Revel tomorrow.  Patient has questions about medication instructions (should he continue taking rxd medications).

## 2016-04-04 NOTE — Telephone Encounter (Signed)
Spoke with patient and he states that we had called to schedule his appointment. Reviewed his discharge medications with him and he wanted to know if they would make him lethargic. Let him know that it could be a side effect of his medication and that Dr. Saunders Revel could discuss with him in more detail tomorrow morning. He states that he feels some better today but is still tired. Instructed him to monitor his blood pressures and bring a list with him tomorrow so that we can see how they are running. He verbalized understanding of our conversation, agreement with plan, and had no further questions at this time. He also confirmed his appointment time for tomorrow 04/05/16 at 10:30AM.

## 2016-04-05 ENCOUNTER — Ambulatory Visit (INDEPENDENT_AMBULATORY_CARE_PROVIDER_SITE_OTHER): Payer: 59 | Admitting: Internal Medicine

## 2016-04-05 ENCOUNTER — Encounter: Payer: Self-pay | Admitting: Internal Medicine

## 2016-04-05 VITALS — BP 120/86 | HR 71 | Ht 77.0 in | Wt 358.0 lb

## 2016-04-05 DIAGNOSIS — R079 Chest pain, unspecified: Secondary | ICD-10-CM

## 2016-04-05 DIAGNOSIS — I480 Paroxysmal atrial fibrillation: Secondary | ICD-10-CM | POA: Diagnosis not present

## 2016-04-05 DIAGNOSIS — I10 Essential (primary) hypertension: Secondary | ICD-10-CM

## 2016-04-05 MED ORDER — ASPIRIN EC 81 MG PO TBEC: 81.0000 mg | DELAYED_RELEASE_TABLET | Freq: Every day | ORAL | 3 refills | Status: DC

## 2016-04-05 NOTE — Progress Notes (Signed)
New Outpatient Visit Date: 04/05/2016  Referring Provider: Fritzi Mandes, MD [facility]  Chief Complaint: Hospital follow-up with palpitations and chest pain  HPI:  Nathaniel Mcgee is a 50 y.o. year-old male with history of hypertension, anxiety (particularly claustrophobia), GERD, and morbid obesity, who has been referred by Dr. Posey Pronto for hospital follow-up after recently diagnosed atrial fibrillation. The patient reports that about 1.5 weeks ago, he noted a fluttering in his chest that came and went over the course of the next week. He had associated fatigue and intermittent chest pain radiating to both arms. Last Friday (4 days ago), the pain and palpitations became significantly worse prompting the patient to go to urgent care for further evaluation. There, he was noted to be in atrial fibrillation with rapid ventricular response and was referred to the [facility] ED. He was kept overnight for observation with initiation of metoprolol and apixaban. Since leaving the hospital, he has continued to have intermittent periods of malaise and chest discomfort. He is concerned that he may also be having panic attacks. For the last several weeks, the patient also reports intermittent headaches and a sense of confusion that waxes and wanes throughout the day. Of note, the patient had been on steroids for treatment of back pain leading up to the episode.  The patient notes several episodes of chest pain in the past that have led to ED visits (none within the [facility]). He states that he underwent an exercise treadmill stress test about 5 years ago that was equivocal. He was referred for a myocardial perfusion stress test but was unable to complete the exam due to claustrophobia during image acquisition. Interestingly, the patient reports that his chest discomfort and palpitations seem to improve when he would get up and walk around. He specifically denies exertional chest pain as well as shortness  of breath. He denies edema in the lower extremities, though his abdomen feels somewhat swollen. He attempted to undergo a sleep study several years ago but could not complete this due to claustrophobia associated with CPAP.  Since hospital discharge, Nathaniel Mcgee has been monitoring his heart rate and blood pressure at home. His heart rates have ranged from 70-109 with blood pressure of 106-143/67-84. He drinks several cups of ice tea a day as well as occasional caffeinated sodas. He denies using other stimulants or illicit drugs but uses smokeless tobacco occasionally. He does not exercise on a routine basis.  --------------------------------------------------------------------------------------------------  Cardiovascular History & Procedures: Cardiovascular Problems:  Atrial fibrillation  Risk Factors:  Hypertension and obesity  Cath/PCI:  None.  CV Surgery:  None.  EP Procedures and Devices:  None  Non-Invasive Evaluation(s):  TTE (04/02/16): Technically difficult study. LVEF 50-55%. Unable to assess for regional wall motion abnormalities or diastolic dysfunction. Normal RV size and function. No significant valvular disease identified.  Recent CV Pertinent Labs: Lab Results  Component Value Date   INR 0.97 04/01/2016   K 3.8 04/01/2016   BUN 14 04/01/2016   CREATININE 0.91 04/01/2016    --------------------------------------------------------------------------------------------------  Past Medical History:  Diagnosis Date  . Atrial fibrillation ([place]) 04/01/2016  . Claustrophobia   . GERD (gastroesophageal reflux disease)   . Hypertension   . Panic attacks     History reviewed. No pertinent surgical history.  Outpatient Encounter Prescriptions as of 04/05/2016  Medication Sig  . apixaban (ELIQUIS) 5 MG TABS tablet Take 1 tablet (5 mg total) by mouth 2 (two) times daily.  . chlorzoxazone (PARAFON) 500 MG tablet  Take 1 tablet by mouth 3 (three) times daily.  .  metoprolol (LOPRESSOR) 50 MG tablet Take 1 tablet (50 mg total) by mouth 2 (two) times daily.  . Multiple Vitamin (MULTIVITAMIN) tablet Take 1 tablet by mouth daily.  . pantoprazole (PROTONIX) 20 MG tablet Take 20 mg by mouth daily as needed for heartburn or indigestion.  . sucralfate (CARAFATE) 1 g tablet Take 1 g by mouth daily as needed (stomach problems).  . [DISCONTINUED] aspirin EC 81 MG tablet Take 324 mg by mouth daily.  Marland Kitchen aspirin EC 81 MG tablet Take 1 tablet (81 mg total) by mouth daily.  . [DISCONTINUED] predniSONE (STERAPRED UNI-PAK 21 TAB) 10 MG (21) TBPK tablet Take 10-60 mg by mouth daily. 6,5,4,3,2,1 Finished today 04/01/2016    No facility-administered encounter medications on file as of 04/05/2016.     Allergies: Sulfa antibiotics  Social History   Social History  . Marital status: Single    Spouse name: N/A  . Number of children: N/A  . Years of education: N/A   Occupational History  . Not on file.   Social History Main Topics  . Smoking status: Former Smoker    Types: Cigarettes    Quit date: 11  . Smokeless tobacco: Current User    Types: Snuff  . Alcohol use No  . Drug use: No  . Sexual activity: No   Other Topics Concern  . Not on file   Social History Narrative  . No narrative on file    Family History  Problem Relation Age of Onset  . Cancer Father 37    bladder    Review of Systems: A 12-system review of systems was performed and was negative except as noted in the HPI.  --------------------------------------------------------------------------------------------------  Physical Exam: BP 120/86 (BP Location: Right Arm, Patient Position: Sitting, Cuff Size: Large)   Pulse 71   Ht 6\' 5"  (1.956 m)   Wt (!) 358 lb (162.4 kg)   SpO2 96%   BMI 42.45 kg/m   General:  Morbidly obese man seated on the exam table. HEENT: No conjunctival pallor or scleral icterus.  Moist mucous membranes.  OP clear. Neck: Supple without lymphadenopathy,  thyromegaly, JVD, or HJR.  No carotid bruit. Lungs: Normal work of breathing.  Clear to auscultation bilaterally without wheezes or crackles. Heart: Distant heart sounds. Regular rate and rhythm without murmurs, rubs, or gallops.  Non-displaced PMI. Abd: Bowel sounds present.  Soft, NT/ND. Unable to assess hepatosplenomegaly due to body habitus. Ext: Trace pretibial edema bilaterally.  Radial, PT, and DP pulses are 2+ bilaterally Skin: warm and dry without rash Neuro: CNIII-XII intact.  Strength and fine-touch sensation intact in upper and lower extremities bilaterally. Psych: Normal mood and affect.  EKG:  Normal sinus rhythm without significant abnormalities. Compared with prior tracing from 04/01/16, atrial fibrillation with rapid ventricular response has resolved.  Lab Results  Component Value Date   WBC 24.4 (H) 04/01/2016   HGB 16.8 04/01/2016   HCT 50.7 04/01/2016   MCV 84.5 04/01/2016   PLT 327 04/01/2016    Lab Results  Component Value Date   NA 138 04/01/2016   K 3.8 04/01/2016   CL 107 04/01/2016   CO2 24 04/01/2016   BUN 14 04/01/2016   CREATININE 0.91 04/01/2016   GLUCOSE 120 (H) 04/01/2016   ALT 50 04/01/2016   Lipid panel (outside facility, 04/21/14): Total cholesterol 200, triglycerides 101, HDL 59, LDL 121  --------------------------------------------------------------------------------------------------  ASSESSMENT AND PLAN: 1. Paroxysmal atrial  fibrillation [facility]) Patient is in sinus rhythm today. He continues to have some vague symptoms which may be related to metoprolol therapy. We discussed the natural history of atrial fibrillation and potential for recurrence. We also discussed the elevated risk of stroke with atrial fibrillation, even if it is paroxysmal. Given his CHADSVASC sore of 1 (hypertension), we have agreed to switch to aspirin 81 mg daily for thromboembolism prophylaxis once he has exhausted his current supply of apixaban. We will continue with his  current dose of metoprolol. I encouraged the patient to limit his caffeine and tobacco use.He should also follow-up with his PCP, with whom he has a physical later this week.  2. Chest pain Chest pain is atypical and may be related to rapid ventricular response associated with atrial fibrillation. Unfortunately, it appears that the patient has been intolerant of SPECT imaging in the past due to claustrophobia. Recent echocardiogram also was technically difficult due to his body habitus. We have agreed to try an exercise treadmill stress test for further risk stratification.  3. Essential hypertension Blood pressure is upper normal today. We will continue with metoprolol.  Follow-up: Return to clinic in about 2 months.  Nelva Bush, MD 04/05/2016 2:11 PM

## 2016-04-05 NOTE — Patient Instructions (Addendum)
Medication Instructions:  Your physician has recommended you make the following change in your medication:  1. CONTINUE Eliquis until your current supply is finished. 2. THEN START Aspirin 81 mg Once daily  Testing/Procedures: Your physician has requested that you have an exercise tolerance test. For further information please visit [email or web address]. Please also follow instruction sheet, as given.   Do not drink or eat foods with caffeine for 24 hours before the test. (Chocolate, coffee, tea, or energy drinks)  If you use an inhaler, bring it with you to the test.  Do not smoke for 4 hours before the test.  Wear comfortable shoes and clothing.  HOLD your metoprolol the night before and morning of your test.   Follow-Up: Your physician recommends that you schedule a follow-up appointment in: 2 months with Dr. Saunders Revel.  It was a pleasure seeing you today here in the office. Please do not hesitate to give [place] a call back if you have any further questions. [place], BSN       Exercise Stress Electrocardiogram An exercise stress electrocardiogram is a test to check how blood flows to your heart. It is done to find areas of poor blood flow. You will need to walk on a treadmill for this test. The electrocardiogram will record your heartbeat when you are at rest and when you are exercising. BEFORE THE PROCEDURE  Do not have drinks with caffeine or foods with caffeine for 24 hours before the test, or as told by your doctor. This includes coffee, tea (even decaf tea), sodas, chocolate, and cocoa.  Follow your doctor's instructions about eating and drinking before the test.  Ask your doctor what medicines you should or should not take before the test. Take your medicines with water unless told by your doctor not to.  If you use an inhaler, bring it with you to the test.  Bring a snack to eat after the test.  Do not  smoke for 4 hours before the test.  Do not put  lotions, powders, creams, or oils on your chest before the test.  Wear comfortable shoes and clothing. PROCEDURE  You will have patches put on your chest. Small areas of your chest may need to be shaved. Wires will be connected to the patches.  Your heart rate will be watched while you are resting and while you are exercising.  You will walk on the treadmill. The treadmill will slowly get faster to raise your heart rate.  The test will take about 1-2 hours. AFTER THE PROCEDURE  Your heart rate and blood pressure will be watched after the test.  You may return to your normal diet, activities, and medicines or as told by your doctor.   This information is not intended to replace advice given to you by your health care provider. Make sure you discuss any questions you have with your health care provider.   Document Released: 11/30/2007 Document Revised: 07/04/2014 Document Reviewed: 02/18/2013 Elsevier Interactive Patient Education [employer].

## 2016-04-12 ENCOUNTER — Ambulatory Visit (INDEPENDENT_AMBULATORY_CARE_PROVIDER_SITE_OTHER): Payer: 59

## 2016-04-12 DIAGNOSIS — I480 Paroxysmal atrial fibrillation: Secondary | ICD-10-CM

## 2016-04-13 LAB — EXERCISE TOLERANCE TEST
CSEPED: 5 min
CSEPEW: 7 METS
CSEPHR: 87 %
CSEPPHR: 150 {beats}/min
MPHR: 172 {beats}/min
Rest HR: 98 {beats}/min

## 2016-04-15 ENCOUNTER — Other Ambulatory Visit: Payer: Self-pay

## 2016-04-15 ENCOUNTER — Telehealth: Payer: Self-pay | Admitting: Internal Medicine

## 2016-04-15 DIAGNOSIS — R9439 Abnormal result of other cardiovascular function study: Secondary | ICD-10-CM

## 2016-04-15 NOTE — Telephone Encounter (Addendum)
CT coronary ordered (confirmed w/Ryan Dunn, PA-C) @ [facility]. Forward to Dr. Saunders Revel to advise reader for the study.

## 2016-04-15 NOTE — Telephone Encounter (Signed)
I spoke with Nathaniel Mcgee regarding the results of his intermediate risk exercise stress test earlier this week. He has felt well without further episodes of chest pain. However, given multiple risk factors, we have agreed to proceed with further evaluation. He has had difficulty with SPECT imaging in the past due to claustrophobia. His recent echo also showed very poor windows, precluding stress echocardiogram. We discussed coronary CTA versus cardiac catheterization and have agreed to the former. We will make arrangements for this test to be done at the patient's earliest convenience. He should remain on his current medications including metoprolol to optimize heart rate control in anticipation of the CTA.  Nelva Bush, MD [facility] Pager: [phone]

## 2016-04-15 NOTE — Telephone Encounter (Signed)
Please have the study assigned to Dr. Johnsie Cancel.  Thank you for your help.  Gerald Stabs

## 2016-04-19 ENCOUNTER — Telehealth: Payer: Self-pay | Admitting: Internal Medicine

## 2016-04-19 NOTE — Telephone Encounter (Signed)
Scheduled CT coronary with Marianna Fuss, [id number].  Oct 31, 8:30am arrival. [facility] Short Stay, Entrance A, 1st floor radiology NPO @ midnight, no smoking 24 hours prior.  Left message on machine for patient to contact the office.

## 2016-04-20 NOTE — Telephone Encounter (Signed)
Left message on pt's home VM. Attempted to contact pt at [place] (work). Unable to s/w pt.

## 2016-04-21 NOTE — Telephone Encounter (Addendum)
Left message on machine for patient to contact the office regarding scheduled test

## 2016-04-22 NOTE — Telephone Encounter (Signed)
S/w pt who states he will need to check work schedule in regards to 10/31 CT coronary. States someone from [place] [facility] called to confirm appt/ location and he was told to let them know Monday if he can keep this appt . Reviewed instructions w/pt who is agreeable w/plan.  He had no further questions at this time.

## 2016-04-26 ENCOUNTER — Encounter (HOSPITAL_COMMUNITY): Payer: Self-pay

## 2016-04-26 ENCOUNTER — Ambulatory Visit (HOSPITAL_COMMUNITY)
Admission: RE | Admit: 2016-04-26 | Discharge: 2016-04-26 | Disposition: A | Discharge status: Home / Self Care | Payer: 59 | Source: Ambulatory Visit | Attending: Internal Medicine | Admitting: Internal Medicine

## 2016-04-26 DIAGNOSIS — R9439 Abnormal result of other cardiovascular function study: Secondary | ICD-10-CM

## 2016-04-26 NOTE — Progress Notes (Signed)
Pt received from waiting room. Pt says he is very nervous and has to know how long this will take. RN advices pt about events of test. Pt will not even come into pt bay. States he is about to have panic attack. States "I am not in right frame of mind today". Pt asks to reschedule. RN let CT and MD know.

## 2016-06-07 ENCOUNTER — Ambulatory Visit (INDEPENDENT_AMBULATORY_CARE_PROVIDER_SITE_OTHER): Payer: 59 | Admitting: Internal Medicine

## 2016-06-07 ENCOUNTER — Encounter: Payer: Self-pay | Admitting: Internal Medicine

## 2016-06-07 VITALS — BP 121/73 | HR 64 | Ht 76.0 in | Wt 358.5 lb

## 2016-06-07 DIAGNOSIS — R0789 Other chest pain: Secondary | ICD-10-CM

## 2016-06-07 DIAGNOSIS — Z01818 Encounter for other preprocedural examination: Secondary | ICD-10-CM

## 2016-06-07 DIAGNOSIS — I480 Paroxysmal atrial fibrillation: Secondary | ICD-10-CM

## 2016-06-07 MED ORDER — DIAZEPAM 5 MG PO TABS: ORAL_TABLET | ORAL | 0 refills | Status: DC

## 2016-06-07 MED ORDER — METOPROLOL TARTRATE 50 MG PO TABS: 50.0000 mg | ORAL_TABLET | Freq: Two times a day (BID) | ORAL | 6 refills | Status: DC

## 2016-06-07 NOTE — Progress Notes (Deleted)
New Outpatient Visit Date: 06/07/2016  Referring Provider: Fritzi Mandes, MD [facility]  Chief Complaint: Hospital follow-up with palpitations and chest pain  HPI:  Nathaniel Mcgee is a 50 y.o. year-old male with history of hypertension, anxiety (particularly claustrophobia), GERD, and morbid obesity, who has been referred by Dr. Posey Pronto for hospital follow-up after recently diagnosed atrial fibrillation. The patient reports that about 1.5 weeks ago, he noted a fluttering in his chest that came and went over the course of the next week. He had associated fatigue and intermittent chest pain radiating to both arms. Last Friday (4 days ago), the pain and palpitations became significantly worse prompting the patient to go to urgent care for further evaluation. There, he was noted to be in atrial fibrillation with rapid ventricular response and was referred to the [facility] ED. He was kept overnight for observation with initiation of metoprolol and apixaban. Since leaving the hospital, he has continued to have intermittent periods of malaise and chest discomfort. He is concerned that he may also be having panic attacks. For the last several weeks, the patient also reports intermittent headaches and a sense of confusion that waxes and wanes throughout the day. Of note, the patient had been on steroids for treatment of back pain leading up to the episode.  The patient notes several episodes of chest pain in the past that have led to ED visits (none within the [place]). He states that he underwent an exercise treadmill stress test about 5 years ago that was equivocal. He was referred for a myocardial perfusion stress test but was unable to complete the exam due to claustrophobia during image acquisition. Interestingly, the patient reports that his chest discomfort and palpitations seem to improve when he would get up and walk around. He specifically denies exertional chest pain as well as shortness  of breath. He denies edema in the lower extremities, though his abdomen feels somewhat swollen. He attempted to undergo a sleep study several years ago but could not complete this due to claustrophobia associated with CPAP.  Since hospital discharge, Nathaniel Mcgee has been monitoring his heart rate and blood pressure at home. His heart rates have ranged from 70-109 with blood pressure of 106-143/67-84. He drinks several cups of ice tea a day as well as occasional caffeinated sodas. He denies using other stimulants or illicit drugs but uses smokeless tobacco occasionally. He does not exercise on a routine basis.  Feeling well.  Has occasional shoulder pain; intermittent flairs of nerve pain.  Sometimes left arm numb with pain across chest.  Has happened in the past with a-fib.  Was not confused like in the hospital.  Pain there every day.  Flairs once a week.  Positional, especially after sleeping and driving.  Tired; vision not as good.  Seeing eye specialist on Friday.  No palpitations.  Sometimes a little weight in the chest.  No edema.  Tired all the time; worse with metoprolol.  Could not tolerate sleep study due to closterphobia.  --------------------------------------------------------------------------------------------------  Cardiovascular History & Procedures: Cardiovascular Problems:  Atrial fibrillation  Risk Factors:  Hypertension and obesity  Cath/PCI:  None.  CV Surgery:  None.  EP Procedures and Devices:  None  Non-Invasive Evaluation(s):  TTE (04/02/16): Technically difficult study. LVEF 50-55%. Unable to assess for regional wall motion abnormalities or diastolic dysfunction. Normal RV size and function. No significant valvular disease identified.  Recent CV Pertinent Labs: Lab Results  Component Value Date   INR 0.97 04/01/2016  K 3.8 04/01/2016   BUN 14 04/01/2016   CREATININE 0.91 04/01/2016     --------------------------------------------------------------------------------------------------  Past Medical History:  Diagnosis Date  . Atrial fibrillation ([place]) 04/01/2016  . Claustrophobia   . GERD (gastroesophageal reflux disease)   . Hypertension   . Panic attacks     History reviewed. No pertinent surgical history.  Outpatient Encounter Prescriptions as of 06/07/2016  Medication Sig  . aspirin EC 81 MG tablet Take 1 tablet (81 mg total) by mouth daily. (Patient taking differently: Take 81 mg by mouth 2 (two) times daily. )  . chlorzoxazone (PARAFON) 500 MG tablet Take 1 tablet by mouth 3 (three) times daily as needed.   . metoprolol (LOPRESSOR) 50 MG tablet Take 1 tablet (50 mg total) by mouth 2 (two) times daily.  . Multiple Vitamin (MULTIVITAMIN) tablet Take 1 tablet by mouth daily.  . pantoprazole (PROTONIX) 20 MG tablet Take 20 mg by mouth daily as needed for heartburn or indigestion.  . sucralfate (CARAFATE) 1 g tablet Take 1 g by mouth daily as needed (stomach problems).  . [DISCONTINUED] apixaban (ELIQUIS) 5 MG TABS tablet Take 1 tablet (5 mg total) by mouth 2 (two) times daily. (Patient not taking: Reported on 06/07/2016)   No facility-administered encounter medications on file as of 06/07/2016.     Allergies: Sulfa antibiotics  Social History   Social History  . Marital status: Single    Spouse name: N/A  . Number of children: N/A  . Years of education: N/A   Occupational History  . Not on file.   Social History Main Topics  . Smoking status: Former Smoker    Types: Cigarettes    Quit date: 87  . Smokeless tobacco: Current User    Types: Snuff  . Alcohol use No  . Drug use: No  . Sexual activity: No   Other Topics Concern  . Not on file   Social History Narrative  . No narrative on file    Family History  Problem Relation Age of Onset  . Cancer Father 70    bladder    Review of Systems: A 12-system review of systems was  performed and was negative except as noted in the HPI.  --------------------------------------------------------------------------------------------------  Physical Exam: BP 121/73 (BP Location: Left Arm, Patient Position: Sitting, Cuff Size: Normal)   Pulse 64   Ht 6\' 4"  (1.93 m)   Wt (!) 358 lb 8 oz (162.6 kg)   BMI 43.64 kg/m   General:  Morbidly obese man seated on the exam table. HEENT: No conjunctival pallor or scleral icterus.  Moist mucous membranes.  OP clear. Neck: Supple without lymphadenopathy, thyromegaly, JVD, or HJR.  No carotid bruit. Lungs: Normal work of breathing.  Clear to auscultation bilaterally without wheezes or crackles. Heart: Distant heart sounds. Regular rate and rhythm without murmurs, rubs, or gallops.  Non-displaced PMI. Abd: Bowel sounds present.  Soft, NT/ND. Unable to assess hepatosplenomegaly due to body habitus. Ext: Trace pretibial edema bilaterally.  Radial, PT, and DP pulses are 2+ bilaterally Skin: warm and dry without rash Neuro: CNIII-XII intact.  Strength and fine-touch sensation intact in upper and lower extremities bilaterally. Psych: Normal mood and affect.  EKG:  Normal sinus rhythm without significant abnormalities. Compared with prior tracing from 04/01/16, atrial fibrillation with rapid ventricular response has resolved.  Lab Results  Component Value Date   WBC 24.4 (H) 04/01/2016   HGB 16.8 04/01/2016   HCT 50.7 04/01/2016   MCV 84.5 04/01/2016  PLT 327 04/01/2016    Lab Results  Component Value Date   NA 138 04/01/2016   K 3.8 04/01/2016   CL 107 04/01/2016   CO2 24 04/01/2016   BUN 14 04/01/2016   CREATININE 0.91 04/01/2016   GLUCOSE 120 (H) 04/01/2016   ALT 50 04/01/2016   Lipid panel (outside facility, 04/21/14): Total cholesterol 200, triglycerides 101, HDL 59, LDL 121  --------------------------------------------------------------------------------------------------  ASSESSMENT AND PLAN: 1. Paroxysmal atrial  fibrillation (HCC) Patient is in sinus rhythm today. He continues to have some vague symptoms which may be related to metoprolol therapy. We discussed the natural history of atrial fibrillation and potential for recurrence. We also discussed the elevated risk of stroke with atrial fibrillation, even if it is paroxysmal. Given his CHADSVASC sore of 1 (hypertension), we have agreed to switch to aspirin 81 mg daily for thromboembolism prophylaxis once he has exhausted his current supply of apixaban. We will continue with his current dose of metoprolol. I encouraged the patient to limit his caffeine and tobacco use.He should also follow-up with his PCP, with whom he has a physical later this week.  2. Chest pain Chest pain is atypical and may be related to rapid ventricular response associated with atrial fibrillation. Unfortunately, it appears that the patient has been intolerant of SPECT imaging in the past due to claustrophobia. Recent echocardiogram also was technically difficult due to his body habitus. We have agreed to try an exercise treadmill stress test for further risk stratification.  3. Essential hypertension Blood pressure is upper normal today. We will continue with metoprolol.  Follow-up: Return to clinic in about 2 months.  Nelva Bush, MD 06/07/2016 11:32 AM

## 2016-06-07 NOTE — Progress Notes (Signed)
Follow-up Outpatient Visit Date: 06/07/2016  Chief Complaint: Follow-up atypical chest pain and paroxysmal atrial fibrillation  HPI:  Nathaniel Mcgee is a 50 y.o. year-old male with history of paroxysmal atrial fibrillation, hypertension, anxiety (particularly claustrophobia, GERD, and morbid obesity, who presents for follow-up of paroxysmal atrial fibrillation and atypical chest pain. I first saw him on 04/05/16 following a hospital visit. He was in sinus rhythm at that time and has not had any further episodes of palpitations suggest recurrent atrial fibrillation. He was on apixaban at that time but felt strongly about discontinuing this medication. Given his low CHADSVASC score (1), we agreed to switch him to aspirin 81 mg daily. Exercise triple stress test was performed for further evaluation of his atypical chest pain demonstrating subtle ST depressions leading to an intermediate probability study. We attempted a cardiac CTA but the patient was unable to perform the study due to claustrophobia.  Today, Nathaniel Mcgee reports that he feels about the same as at our last visit. He continues to have some fatigue, which she attributes to metoprolol and generalized deconditioning. He continues to have intermittent sharp pains along the left side of his neck that radiated into the left arm and occasionally across the chest. This seems to be positional and related to activity such as lifting objects. He notes that in the past he was told that he has nerve problems in his neck. The episodes happen several times a day and her not clearly exertional. He denies accompanying dyspnea, palpitations, and lightheadedness. The patient endorses some issues with breathing at night, including orthopnea. He attempted a sleep study in the past but could not complete it due to claustrophobia when wearing a mask. He does not wish to try this  again.  -------------------------------------------------------------------------------------------------- Cardiovascular History & Procedures: Cardiovascular Problems:  Atrial fibrillation  Risk Factors:  Hypertension and obesity  Cath/PCI:  None.  CV Surgery:  None.  EP Procedures and Devices:  None  Non-Invasive Evaluation(s):  Exercise tolerance test (04/12/16): Hypertensive blood pressure response with upsloping 1 mm ST segment deviation in the inferior and lateral leads. Low exercise capacity, exercising for 5 minutes (7 METs) and achieving peak heart rate of 150 bpm.  TTE (04/02/16): Technically difficult study. LVEF 50-55%. Unable to assess for regional wall motion abnormalities or diastolic dysfunction. Normal RV size and function. No significant valvular disease identified.  Recent CV Pertinent Labs: Lab Results  Component Value Date   INR 0.97 04/01/2016   K 3.8 04/01/2016   BUN 14 04/01/2016   CREATININE 0.91 04/01/2016    Past medical and surgical history were reviewed and updated in EPIC.   Outpatient Encounter Prescriptions as of 06/07/2016  Medication Sig  . aspirin EC 81 MG tablet Take 1 tablet (81 mg total) by mouth daily. (Patient taking differently: Take 81 mg by mouth 2 (two) times daily. )  . metoprolol (LOPRESSOR) 50 MG tablet Take 1 tablet (50 mg total) by mouth 2 (two) times daily.  . [DISCONTINUED] metoprolol (LOPRESSOR) 50 MG tablet Take 1 tablet (50 mg total) by mouth 2 (two) times daily.  . chlorzoxazone (PARAFON) 500 MG tablet Take 1 tablet by mouth 3 (three) times daily as needed.   . diazepam (VALIUM) 5 MG tablet Take 1 tablet (5mg ) prior to CT procedure, then may take additional 5 mg (1 tablet) if needed for anxiety.  . Multiple Vitamin (MULTIVITAMIN) tablet Take 1 tablet by mouth daily.  . pantoprazole (PROTONIX) 20 MG tablet Take 20 mg by mouth daily  as needed for heartburn or indigestion.  . sucralfate (CARAFATE) 1 g tablet Take 1  g by mouth daily as needed (stomach problems).  . [DISCONTINUED] apixaban (ELIQUIS) 5 MG TABS tablet Take 1 tablet (5 mg total) by mouth 2 (two) times daily. (Patient not taking: Reported on 06/07/2016)   No facility-administered encounter medications on file as of 06/07/2016.     Allergies: Sulfa antibiotics  Social History   Social History  . Marital status: Single    Spouse name: N/A  . Number of children: N/A  . Years of education: N/A   Occupational History  . Not on file.   Social History Main Topics  . Smoking status: Former Smoker    Types: Cigarettes    Quit date: 34  . Smokeless tobacco: Current User    Types: Snuff  . Alcohol use No  . Drug use: No  . Sexual activity: No   Other Topics Concern  . Not on file   Social History Narrative  . No narrative on file    Family History  Problem Relation Age of Onset  . Cancer Father 33    bladder    Review of Systems: A 12-system review of systems was performed and was negative except as noted in the HPI.  --------------------------------------------------------------------------------------------------  Physical Exam: BP 121/73 (BP Location: Left Arm, Patient Position: Sitting, Cuff Size: Normal)   Pulse 64   Ht 6\' 4"  (1.93 m)   Wt (!) 358 lb 8 oz (162.6 kg)   BMI 43.64 kg/m   General:  Morbidly obese man, seated comfortably in the exam room. HEENT: No conjunctival pallor or scleral icterus.  Moist mucous membranes.  OP clear. Neck: Supple without lymphadenopathy, thyromegaly, JVD, or HJR. Lungs: Normal work of breathing.  Clear to auscultation bilaterally without wheezes or crackles. Heart: Regular rate and rhythm without murmurs, rubs, or gallops.  Unable to assess PMI due to body habitus. Abd: Bowel sounds present.  Soft with mild right abdominal tenderness. No rebound or guarding. Unable to assess hepatosplenomegaly due to body habitus. Ext: Trace pretibial edema bilaterally.  Radial, PT, and DP  pulses are 2+ bilaterally. Skin: warm and dry without rash  Lab Results  Component Value Date   WBC 24.4 (H) 04/01/2016   HGB 16.8 04/01/2016   HCT 50.7 04/01/2016   MCV 84.5 04/01/2016   PLT 327 04/01/2016    Lab Results  Component Value Date   NA 138 04/01/2016   K 3.8 04/01/2016   CL 107 04/01/2016   CO2 24 04/01/2016   BUN 14 04/01/2016   CREATININE 0.91 04/01/2016   GLUCOSE 120 (H) 04/01/2016   ALT 50 04/01/2016   Lipid panel (04/21/14): Total cholesterol 200, triglycerides 101, HDL 59, LDL 121  --------------------------------------------------------------------------------------------------  ASSESSMENT AND PLAN: Atypical chest pain: Symptoms are stable. Given that his predominant complaint is of positional neck pain that occasionally radiates to the left arm and chest, I suspect this is most likely neuropathic. However, the patient has cardiovascular risk factors including hypertension and obesity. He has agreed to try the cardiac CTA again with a sedative. I have provided him with a prescription for diazepam 5 mg (2 tablets). He should take these before the CTA but after arriving at the hospital and providing consent. We will also check a lipid panel for further risk stratification. The patient should continue his current medications including low-dose aspirin and metoprolol.  Paroxysmal atrial fibrillation: No symptoms to suggest recurrence. Patient's heart rate is regular today.  Given his CHADSVASC score of 1, we will continue with aspirin and metoprolol.  Follow-up: Return to clinic in 3 months.  Nelva Bush, MD 06/07/2016 10:08 PM

## 2016-06-07 NOTE — Patient Instructions (Addendum)
Medication Instructions:  Your physician recommends that you continue on your current medications as directed. Please refer to the Current Medication list given to you today.  Dr End has prescribed you Valium to take prior to your CT to help with anxiety.  - You may take Valium 5mg  (1 tablet) by mouth prior to procedure and then take an additional 5 mg (1 tablet) if needed. Do not drive while taking this medication.   Labwork: Your physician recommends that you return for lab work in: [place]. (BMET) Please come to the [facility].  Testing/Procedures: Non-Cardiac CT Angiography (CTA), is a special type of CT scan that uses a computer to produce multi-dimensional views of major blood vessels throughout the body. In CT angiography, a contrast material is injected through an IV to help visualize the blood vessels. Someone will call you to schedule the CT. If they don't call you in a reasonable timeframe, please don't hesitate to call.    Follow-Up: Your physician recommends that you schedule a follow-up appointment in: 3 MONTHS WITH DR END.   Any Other Special Instructions Will Be Listed Below (If Applicable).     If you need a refill on your cardiac medications before your next appointment, please call your pharmacy.   CT Angiogram A CT angiogram is a procedure to look at the blood vessels in various areas of the body. For this procedure, a large X-ray machine, called a CT scanner, takes detailed pictures of blood vessels that have been injected with a dye (contrast material). A CT angiogram allows your health care provider to see how well blood is flowing to the area of your body that is being checked. Your health care provider will be able to see if there are any problems, such as a blockage. Tell a health care provider about:  Any allergies you have.  All medicines you are taking, including vitamins, herbs, eye drops, creams, and over-the-counter  medicines.  Any problems you or family members have had with anesthetic medicines.  Any blood disorders you have.  Any surgeries you have had.  Any medical conditions you have.  Whether you are pregnant or may be pregnant.  Whether you are breastfeeding.  Any anxiety disorders, chronic pain, or other conditions you have that may increase your stress or prevent you from lying still. What are the risks? Generally, this is a safe procedure. However, problems may occur, including:  Infection.  Bleeding.  Allergic reactions to medicines or dyes.  Damage to other structures or organs.  Kidney damage from the dye or contrast that is used.  Increased risk of cancer from radiation exposure. This risk is low. Talk with your health care provider about:  The risks and benefits of testing.  How you can receive the lowest dose of radiation. What happens before the procedure?  Wear comfortable clothing and remove any jewelry.  Follow instructions from your health care provider about eating and drinking. For most people, instructions may include these actions:  For 12 hours before the test, avoid caffeine. This includes tea, coffee, soda, and energy drinks or pills.  For 3-4 hours before the test, stop eating or drinking anything but water.  Stay well hydrated by continuing to drink water before the exam. This will help to clear the contrast dye from your body after the test.  Ask your health care provider about changing or stopping your regular medicines. This is especially important if you are taking diabetes  medicines or blood thinners. What happens during the procedure?  An IV tube will be inserted into one of your veins.  You will be asked to lie on an exam table. This table will slide in and out of the CT machine during the procedure.  Contrast dye will be injected into the IV tube. You might feel warm, or you may get a metallic taste in your mouth.  The table that you are  lying on will move into the CT machine tunnel for the scan.  The person running the machine will give you instructions while the scans are being done. You may be asked to:  Keep your arms above your head.  Hold your breath.  Stay very still, even if the table is moving.  When the scanning is complete, you will be moved out of the machine.  The IV tube will be removed. The procedure may vary among health care providers and hospitals. What happens after the procedure?  You might feel warm, or you may get a metallic taste in your mouth.  You may be asked to drink water or other fluids to wash (flush) the contrast material out of your body.  It is up to you to get the results of your procedure. Ask your health care provider, or the department that is doing the procedure, when your results will be ready. Summary  A CT angiogram is a procedure to look at the blood vessels in various areas of the body.  You will need to stay very still during the exam.  You may be asked to drink water or other fluids to wash (flush) the contrast material out of your body after your scan. This information is not intended to replace advice given to you by your health care provider. Make sure you discuss any questions you have with your health care provider. Document Released: 02/11/2016 Document Revised: 02/11/2016 Document Reviewed: 02/11/2016 Elsevier Interactive Patient Education  2017 [employer].

## 2016-06-08 ENCOUNTER — Telehealth: Payer: Self-pay | Admitting: *Deleted

## 2016-06-08 DIAGNOSIS — Z13220 Encounter for screening for lipoid disorders: Secondary | ICD-10-CM

## 2016-06-08 NOTE — Telephone Encounter (Signed)
-----   Message from Nelva Bush, MD sent at 06/07/2016 10:20 PM EST ----- Claris Gladden,  Sorry for not thinking about this earlier.  Since Mr. Nathaniel Mcgee is going to have labs drawn prior to his CTA, can we check a lipid panel at that time as well?  Thanks.  Gerald Stabs

## 2016-06-08 NOTE — Telephone Encounter (Signed)
Patient not home. Left message with family member to give [place] a call back.  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 Patient will need to have the lab slips mailed or come by and pick them up to take to the [facility] of his choice.  Order entered for patient to have lipid panel as well.

## 2016-06-08 NOTE — Telephone Encounter (Signed)
Received incoming call from patient. Gave him the information that Dr End requested to check lipid panel as well. He verbalizing understanding he would need to be fasting for the lab work. Patient will come by Monday to pick up the lab slips to take to his [facility]. Patient aware to ask [facility] to fax the labs to our office. Fax number provided with lab slips. Patient also went ahead and schedule 3 month f/u appt with Dr End. Patient has not been called about scheduling the CTA, message has been sent to scheduling. Patient will call back later this week if he has not heard anything.

## 2016-06-13 ENCOUNTER — Encounter: Payer: Self-pay | Admitting: Internal Medicine

## 2016-06-13 ENCOUNTER — Telehealth: Payer: Self-pay | Admitting: Internal Medicine

## 2016-06-13 NOTE — Telephone Encounter (Signed)
Patient hasn't received a call from the CT dept. Also needs his valium called into [facility] in [place] so he can have the CT.

## 2016-06-13 NOTE — Telephone Encounter (Signed)
Spoke with patient and he was notified earlier today about CT being rescheduled to 06/21/16 at 09:30AM. Prescription was printed at last visit for the valium and instructed him to call back if he is unable to find that prescription. He verbalized understanding with no further questions at this time.

## 2016-06-13 NOTE — Telephone Encounter (Signed)
Left voicemail message to call back  

## 2016-06-14 LAB — BASIC METABOLIC PANEL
BUN/Creatinine Ratio: 11 (ref 9–20)
BUN: 12 mg/dL (ref 6–24)
CALCIUM: 9.5 mg/dL (ref 8.7–10.2)
CO2: 24 mmol/L (ref 18–29)
CREATININE: 1.07 mg/dL (ref 0.76–1.27)
Chloride: 103 mmol/L (ref 96–106)
GFR calc Af Amer: 94 mL/min/{1.73_m2} (ref >59)
GFR, EST NON AFRICAN AMERICAN: 82 mL/min/{1.73_m2} (ref >59)
Glucose: 94 mg/dL (ref 65–99)
Potassium: 4.6 mmol/L (ref 3.5–5.2)
SODIUM: 138 mmol/L (ref 134–144)

## 2016-06-14 LAB — LIPID PANEL
CHOL/HDL RATIO: 3.5 ratio (ref 0.0–5.0)
Cholesterol, Total: 173 mg/dL (ref 100–199)
HDL: 50 mg/dL (ref >39)
LDL CALC: 105 mg/dL — AB (ref 0–99)
TRIGLYCERIDES: 88 mg/dL (ref 0–149)
VLDL Cholesterol Cal: 18 mg/dL (ref 5–40)

## 2016-06-21 ENCOUNTER — Encounter (HOSPITAL_COMMUNITY): Payer: Self-pay

## 2016-06-21 ENCOUNTER — Ambulatory Visit (HOSPITAL_COMMUNITY)
Admission: RE | Admit: 2016-06-21 | Discharge: 2016-06-21 | Disposition: A | Discharge status: Home / Self Care | Payer: 59 | Source: Ambulatory Visit | Attending: Internal Medicine | Admitting: Internal Medicine

## 2016-06-21 DIAGNOSIS — R9439 Abnormal result of other cardiovascular function study: Secondary | ICD-10-CM | POA: Diagnosis present

## 2016-06-21 DIAGNOSIS — R079 Chest pain, unspecified: Secondary | ICD-10-CM

## 2016-06-21 MED ORDER — METOPROLOL TARTRATE 5 MG/5ML IV SOLN
5.0000 mg | Freq: Once | INTRAVENOUS | Status: AC
Start: 2016-06-21 — End: 2016-06-21
  Administered 2016-06-21: 5 mg via INTRAVENOUS
  Filled 2016-06-21: qty 5

## 2016-06-21 MED ORDER — NITROGLYCERIN 0.4 MG SL SUBL
0.8000 mg | SUBLINGUAL_TABLET | Freq: Once | SUBLINGUAL | Status: DC
  Filled 2016-06-21: qty 25

## 2016-06-21 MED ORDER — NITROGLYCERIN 0.4 MG SL SUBL
SUBLINGUAL_TABLET | SUBLINGUAL | Status: AC
  Filled 2016-06-21: qty 2

## 2016-06-21 MED ORDER — NITROGLYCERIN 0.4 MG SL SUBL
0.8000 mg | SUBLINGUAL_TABLET | Freq: Once | SUBLINGUAL | Status: AC
  Administered 2016-06-21: 0.8 mg via SUBLINGUAL
  Filled 2016-06-21: qty 25

## 2016-06-21 MED ORDER — METOPROLOL TARTRATE 5 MG/5ML IV SOLN
INTRAVENOUS | Status: AC
  Filled 2016-06-21: qty 5

## 2016-06-21 MED ORDER — METOPROLOL TARTRATE 5 MG/5ML IV SOLN
5.0000 mg | Freq: Once | INTRAVENOUS | Status: DC
  Filled 2016-06-21: qty 5

## 2016-06-21 MED ORDER — IOPAMIDOL (ISOVUE-370) INJECTION 76%
INTRAVENOUS | Status: AC
  Administered 2016-06-21: 80 mL
  Filled 2016-06-21: qty 100

## 2016-09-06 ENCOUNTER — Ambulatory Visit: Payer: Self-pay | Admitting: Internal Medicine

## 2016-09-13 ENCOUNTER — Encounter: Payer: Self-pay | Admitting: Internal Medicine

## 2016-09-13 ENCOUNTER — Ambulatory Visit (INDEPENDENT_AMBULATORY_CARE_PROVIDER_SITE_OTHER): Payer: 59 | Admitting: Internal Medicine

## 2016-09-13 VITALS — BP 130/88 | HR 62 | Ht 76.0 in | Wt 371.2 lb

## 2016-09-13 DIAGNOSIS — R0789 Other chest pain: Secondary | ICD-10-CM

## 2016-09-13 DIAGNOSIS — I480 Paroxysmal atrial fibrillation: Secondary | ICD-10-CM

## 2016-09-13 DIAGNOSIS — R079 Chest pain, unspecified: Secondary | ICD-10-CM | POA: Insufficient documentation

## 2016-09-13 NOTE — Progress Notes (Signed)
Follow-up Outpatient Visit Date: 09/13/2016  Chief Complaint: Follow-up atypical chest pain and paroxysmal atrial fibrillation  HPI:  Nathaniel Mcgee is a 51 y.o. year-old male with history of paroxysmal atrial fibrillation, hypertension, anxiety (particularly claustrophobia, GERD, and morbid obesity, who presents for follow-up of paroxysmal atrial fibrillation and atypical chest pain. I last saw him on 06/07/16. Since then, he has only had 1 episode of chest pain, which occurred after having missed a few doses of metoprolol. He continues to have occasional neck/shoulder pain, which is positional. He attributes this to his history of a "pinched nerve." He denies palpitations, lightheadedness, and shortness of breeath. He notes occasional dependent edema but denies claudication. He endorses snoring but states that he would not be able to tolerate a sleep study due to his claustrophobia. He was able to go through with the coronary CTA after receiving diazepam for premedication; this showed normal coronary arteries and no other significant abnormalities within the chest. He remains compliant with ASA and metoprolol, though he is concerned that metoprolol may be contributing some to his fatigue.  -------------------------------------------------------------------------------------------------- Cardiovascular History & Procedures: Cardiovascular Problems:  Atrial fibrillation  Risk Factors:  Hypertension and obesity  Cath/PCI:  None.  CV Surgery:  None.  EP Procedures and Devices:  None  Non-Invasive Evaluation(s):  Coronary CTA (06/21/16): Normal coronary arteries without stenosis or calcification (right-dominant). Coronary calcium score 0. No acute extracardiac process in the imaged chest. Hepatic steatosis noted.  Exercise tolerance test (04/12/16): Hypertensive blood pressure response with upsloping 1 mm ST segment deviation in the inferior and lateral leads. Low exercise capacity,  exercising for 5 minutes (7 METs) and achieving peak heart rate of 150 bpm.  TTE (04/02/16): Technically difficult study. LVEF 50-55%. Unable to assess for regional wall motion abnormalities or diastolic dysfunction. Normal RV size and function. No significant valvular disease identified.  Recent CV Pertinent Labs: Lab Results  Component Value Date   CHOL 173 06/13/2016   HDL 50 06/13/2016   LDLCALC 105 (H) 06/13/2016   TRIG 88 06/13/2016   CHOLHDL 3.5 06/13/2016   INR 0.97 04/01/2016   K 4.6 06/13/2016   BUN 12 06/13/2016   CREATININE 1.07 06/13/2016    Past medical and surgical history were reviewed and updated in EPIC.   Outpatient Encounter Prescriptions as of 09/13/2016  Medication Sig  . aspirin EC 81 MG tablet Take 1 tablet (81 mg total) by mouth daily. (Patient taking differently: Take 81 mg by mouth 2 (two) times daily. )  . chlorzoxazone (PARAFON) 500 MG tablet Take 1 tablet by mouth 3 (three) times daily as needed.   . metoprolol (LOPRESSOR) 50 MG tablet Take 1 tablet (50 mg total) by mouth 2 (two) times daily.  . Multiple Vitamin (MULTIVITAMIN) tablet Take 1 tablet by mouth daily.  . pantoprazole (PROTONIX) 20 MG tablet Take 20 mg by mouth daily as needed for heartburn or indigestion.  . sucralfate (CARAFATE) 1 g tablet Take 1 g by mouth daily as needed (stomach problems).  . [DISCONTINUED] diazepam (VALIUM) 5 MG tablet Take 1 tablet (5mg ) prior to CT procedure, then may take additional 5 mg (1 tablet) if needed for anxiety.   No facility-administered encounter medications on file as of 09/13/2016.     Allergies: Sulfa antibiotics  Social History   Social History  . Marital status: Single    Spouse name: N/A  . Number of children: N/A  . Years of education: N/A   Occupational History  . Not on file.  Social History Main Topics  . Smoking status: Former Smoker    Types: Cigarettes    Quit date: 61  . Smokeless tobacco: Current User    Types: Snuff  .  Alcohol use No  . Drug use: No  . Sexual activity: No   Other Topics Concern  . Not on file   Social History Narrative  . No narrative on file    Family History  Problem Relation Age of Onset  . Cancer Father 71    bladder    Review of Systems: Patient notes intermittent headaches, which he attributes to his eyes (though recent optometry visit was reportedly unremarkable). A 12-system review of systems was performed and was negative except as noted in the HPI.  --------------------------------------------------------------------------------------------------  Physical Exam: BP 130/88 (BP Location: Left Arm, Patient Position: Sitting, Cuff Size: Normal)   Pulse 62   Ht 6\' 4"  (1.93 m)   Wt (!) 371 lb 4 oz (168.4 kg)   BMI 45.19 kg/m   General:  Morbidly obese man, seated comfortably in the exam room. HEENT: No conjunctival pallor or scleral icterus.  Moist mucous membranes.  OP clear. Neck: Supple without lymphadenopathy, thyromegaly, JVD, or HJR. Lungs: Normal work of breathing.  Clear to auscultation bilaterally without wheezes or crackles. Heart: Regular rate and rhythm without murmurs, rubs, or gallops.  Unable to assess PMI due to body habitus. Abd: Bowel sounds present.  Soft NT/ND. Unable to assess hepatosplenomegaly due to body habitus. Ext: Trace pretibial edema bilaterally.  Radial, PT, and DP pulses are 2+ bilaterally. Skin: warm and dry without rash  EKG: Normal sinus rhythm without significant abnormalities.  Lab Results  Component Value Date   WBC 24.4 (H) 04/01/2016   HGB 16.8 04/01/2016   HCT 50.7 04/01/2016   MCV 84.5 04/01/2016   PLT 327 04/01/2016    Lab Results  Component Value Date   NA 138 06/13/2016   K 4.6 06/13/2016   CL 103 06/13/2016   CO2 24 06/13/2016   BUN 12 06/13/2016   CREATININE 1.07 06/13/2016   GLUCOSE 94 06/13/2016   ALT 50 04/01/2016   Lab Results  Component Value Date   CHOL 173 06/13/2016   HDL 50 06/13/2016    LDLCALC 105 (H) 06/13/2016   TRIG 88 06/13/2016   CHOLHDL 3.5 06/13/2016    --------------------------------------------------------------------------------------------------  ASSESSMENT AND PLAN: Atypical chest pain: Symptoms have all but resolved. Coronary CTA was normal without evidence of CAD or coronary artery calcium. We will continue with primary prevention, including weight loss, exercise, and tobacco cessation.  Paroxysmal atrial fibrillation: Patient has been asymptomatic. We will will continue with low-dose ASA for stroke prophylaxis, given his CHADSVASc score of 1 (hypertension). We discussed switching to an alternate beta-blocker or calcium channel blocker to see if this improves his fatigue; however, Mr. Borg would like to continue with metoprolol.  Follow-up: Return to clinic in 6 months.  Nelva Bush, MD 09/13/2016 10:01 PM

## 2016-09-13 NOTE — Patient Instructions (Signed)
Medication Instructions:  Your physician recommends that you continue on your current medications as directed. Please refer to the Current Medication list given to you today.   Labwork: none  Testing/Procedures: none  Follow-Up: Your physician wants you to follow-up in: 6 months with Dr. End.  You will receive a reminder letter in the mail two months in advance. If you don't receive a letter, please call our office to schedule the follow-up appointment.   Any Other Special Instructions Will Be Listed Below (If Applicable).     If you need a refill on your cardiac medications before your next appointment, please call your pharmacy.   

## 2017-03-06 ENCOUNTER — Other Ambulatory Visit: Payer: Self-pay | Admitting: Internal Medicine

## 2017-03-28 ENCOUNTER — Ambulatory Visit (INDEPENDENT_AMBULATORY_CARE_PROVIDER_SITE_OTHER): Payer: 59 | Admitting: Nurse Practitioner

## 2017-03-28 ENCOUNTER — Encounter: Payer: Self-pay | Admitting: Nurse Practitioner

## 2017-03-28 VITALS — BP 130/86 | HR 60 | Ht 77.0 in | Wt 361.5 lb

## 2017-03-28 DIAGNOSIS — R0789 Other chest pain: Secondary | ICD-10-CM

## 2017-03-28 DIAGNOSIS — I10 Essential (primary) hypertension: Secondary | ICD-10-CM | POA: Diagnosis not present

## 2017-03-28 DIAGNOSIS — I480 Paroxysmal atrial fibrillation: Secondary | ICD-10-CM | POA: Diagnosis not present

## 2017-03-28 MED ORDER — METOPROLOL TARTRATE 50 MG PO TABS: 50.0000 mg | ORAL_TABLET | Freq: Two times a day (BID) | ORAL | 3 refills | Status: DC

## 2017-03-28 MED ORDER — SILDENAFIL CITRATE 50 MG PO TABS: 50.0000 mg | ORAL_TABLET | Freq: Every day | ORAL | 1 refills | Status: DC | PRN

## 2017-03-28 NOTE — Progress Notes (Signed)
Office Visit    Patient Name: Nathaniel Mcgee Date of Encounter: 03/28/2017  Primary Care Provider:  [name], NP Primary Cardiologist:  Andree Coss, MD   Chief Complaint    51 year old male with a history of paroxysmal atrial fibrillation, hypertension, atypical chest pain, claustrophobia, GERD, morbid obesity, who presents for follow-up.  Past Medical History    Past Medical History:  Diagnosis Date  . Atypical chest pain    a. In setting of Afib in the past;  b. 03/2016 ETT: poor ex tolerance w/ hypertensive response and 34mm upslopting inflat ST dep;  c. 05/2016 Cardiac CTA: Ca2+ score = 0. Nl right dominant cors.  . Claustrophobia   . GERD (gastroesophageal reflux disease)   . Hypertension   . Morbid obesity ([place])   . PAF (paroxysmal atrial fibrillation) ([place]) 04/01/2016   a. 03/2016 Afib dx-->echo: EF 50-55%, no rwma;  b. CHA2DSVASc = 1-->ASA only.  . Panic attacks   . Snores    a. Had sleep study but had trouble sleeping.  Doesn't think he'd tolerate CPAP/nasal pillow on face anyway.   Past Surgical History:  Procedure Laterality Date  . KNEE ARTHROSCOPY  1998   right knee  . NASAL SINUS SURGERY  1996    Allergies  Allergies  Allergen Reactions  . Sulfa Antibiotics Other (See Comments)    "feels like my bones were burning"    History of Present Illness    51 year old male with the above past medical history including atypical chest pain, claustrophobia, GERD, obesity, paroxysmal atrial fibrillation, and snoring. He was diagnosed with atrial fibrillation October 2017. He has been maintaining sinus rhythm on beta blocker therapy. He is not anticoagulated in the setting of a CHA2DS2VASc of 1. He also underwent stress testing for atypical chest pain in October 2017. He showed poor exercise tolerance with upsloping ST segment changes. This was followed by cardiac CT angiography which showed normal coronary arteries with a calcium score is 0. He was last seen in clinic  just over 6 months ago and has been doing well. He has a history of fatigue which may or may not be attributable to metoprolol. Either way, he is not interested in switching from metoprolol to an alternate beta blocker or calcium channel blocker. He denies chest pain, dyspnea, palpitations, PND, orthopnea, dizziness, syncope, edema, or early satiety. History of chronic neck and upper back pain and also has been experiencing erectile dysfunction, for which he is interested in medical therapy.  Home Medications    Prior to Admission medications   Medication Sig Start Date End Date Taking? Authorizing Provider  aspirin EC 81 MG tablet Take 1 tablet (81 mg total) by mouth daily. Patient taking differently: Take 81 mg by mouth as needed.  04/05/16  Yes End, Harrell Gave, MD  chlorzoxazone (PARAFON) 500 MG tablet Take 1 tablet by mouth 3 (three) times daily as needed.  03/25/16  Yes [provider]  metoprolol tartrate (LOPRESSOR) 50 MG tablet Take 1 tablet (50 mg total) by mouth 2 (two) times daily. 03/28/17  Yes Rogelia Mire, NP  Multiple Vitamin (MULTIVITAMIN) tablet Take 1 tablet by mouth daily.   Yes [provider]  sucralfate (CARAFATE) 1 g tablet Take 1 g by mouth daily as needed (stomach problems).   Yes [provider]  sildenafil (VIAGRA) 50 MG tablet Take 1 tablet (50 mg total) by mouth daily as needed for erectile dysfunction. 03/28/17   Rogelia Mire, NP  Review of Systems    He denies chest pain, palpitations, dyspnea, pnd, orthopnea, n, v, dizziness, syncope, edema, weight gain, or early satiety. He does have some degree of fatigue which is chronic. He also has chronic upper back and neck pain. He has been dealing with some degree of erectile dysfunction as well..  All other systems reviewed and are otherwise negative except as noted above.  Physical Exam    VS:  BP 130/86 (BP Location: Left Arm, Patient Position: Sitting, Cuff Size: Large)    Pulse 60   Ht 6\' 5"  (1.956 m)   Wt (!) 361 lb 8 oz (164 kg)   BMI 42.87 kg/m  , BMI Body mass index is 42.87 kg/m. GEN: Well nourished, well developed, in no acute distress.  HEENT: normal.  Neck: Supple, no JVD, carotid bruits, or masses. Cardiac: RRR, no murmurs, rubs, or gallops. No clubbing, cyanosis, edema.  Radials/DP/PT 2+ and equal bilaterally.  Respiratory:  Respirations regular and unlabored, clear to auscultation bilaterally. GI: Soft, nontender, nondistended, BS + x 4. MS: no deformity or atrophy. Skin: warm and dry, no rash. Neuro:  Strength and sensation are intact. Psych: Normal affect.  Accessory Clinical Findings    ECG - Regular sinus rhythm, 60, no acute ST or T changes.  Assessment & Plan    1.  Paroxysmal atrial fibrillation: No recent palpitations. Remains on beta blocker and aspirin therapy. CHA2DS2VASc 1. He does have some degree of chronic fatigue and is unclear to what extent beta blocker therapy may be playing a role. I did offer to switch him to bisoprolol or diltiazem. He says he would rather just stick with metoprolol.  2. Essential hypertension: Blood pressure stable on beta blocker therapy.  3. Erectile dysfunction: This is been an increasing issue for him. We did discuss some metoprolol might be playing a role. He is not interested in trying alternate beta blocker. He has requested a prescription for Viagra and I did provide him with a prescription for 50 mg as needed. He was advised to avoid the use of nitrates. He has no prior history of CAD and is not currently prescribed nitrates.  4. Snoring: High suspicion for sleep apnea though patient says he previously tried a sleep study but because of insomnia was not able to fall sleep. Regardless, he says he would not be able to tolerate anything on his face for any prolonged period of time secondary to history of claustrophobia. He is not interested in repeating sleep study.  5. Morbid obesity: He is  interested in exercising and did encourage him to partake in As regimen. He would greatly benefit from weight loss.  6. Disposition: Follow-up in 8-12 months or sooner if necessary.   Murray Hodgkins, NP 03/28/2017, 5:41 PM

## 2017-03-28 NOTE — Patient Instructions (Signed)
Medication Instructions:  We've sent in refills on your metoprolol for 1 year Please start Viagra 50 mg as needed  Labwork: None  Testing/Procedures: None  Follow-Up: Your physician wants you to follow-up in: 1 year with Dr. Saunders Revel  You will receive a reminder letter in the mail two months in advance.  If you don't receive a letter, please call our office to schedule the follow-up appointment.   If you need a refill on your cardiac medications before your next appointment, please call your pharmacy.

## 2018-03-28 ENCOUNTER — Other Ambulatory Visit: Payer: Self-pay

## 2018-03-28 MED ORDER — SILDENAFIL CITRATE 50 MG PO TABS: 50.0000 mg | ORAL_TABLET | Freq: Every day | ORAL | 1 refills | Status: DC | PRN

## 2018-09-11 ENCOUNTER — Encounter: Payer: Self-pay | Admitting: *Deleted

## 2018-09-12 ENCOUNTER — Other Ambulatory Visit: Payer: Self-pay

## 2018-09-12 ENCOUNTER — Ambulatory Visit: Payer: Managed Care, Other (non HMO) | Admitting: Anesthesiology

## 2018-09-12 ENCOUNTER — Encounter: Payer: Self-pay | Admitting: *Deleted

## 2018-09-12 ENCOUNTER — Encounter
Admission: RE | Disposition: A | Discharge status: Home / Self Care | Payer: Self-pay | Source: Home / Self Care | Attending: Internal Medicine

## 2018-09-12 ENCOUNTER — Ambulatory Visit
Admission: RE | Admit: 2018-09-12 | Discharge: 2018-09-12 | Disposition: A | Discharge status: Home / Self Care | Payer: Managed Care, Other (non HMO) | Attending: Internal Medicine | Admitting: Internal Medicine

## 2018-09-12 DIAGNOSIS — K219 Gastro-esophageal reflux disease without esophagitis: Secondary | ICD-10-CM | POA: Diagnosis present

## 2018-09-12 DIAGNOSIS — Z7982 Long term (current) use of aspirin: Secondary | ICD-10-CM | POA: Diagnosis not present

## 2018-09-12 DIAGNOSIS — K3189 Other diseases of stomach and duodenum: Secondary | ICD-10-CM | POA: Diagnosis not present

## 2018-09-12 DIAGNOSIS — K5730 Diverticulosis of large intestine without perforation or abscess without bleeding: Secondary | ICD-10-CM | POA: Diagnosis not present

## 2018-09-12 DIAGNOSIS — D125 Benign neoplasm of sigmoid colon: Secondary | ICD-10-CM | POA: Diagnosis not present

## 2018-09-12 DIAGNOSIS — N529 Male erectile dysfunction, unspecified: Secondary | ICD-10-CM | POA: Diagnosis not present

## 2018-09-12 DIAGNOSIS — Z882 Allergy status to sulfonamides status: Secondary | ICD-10-CM | POA: Insufficient documentation

## 2018-09-12 DIAGNOSIS — G4730 Sleep apnea, unspecified: Secondary | ICD-10-CM | POA: Insufficient documentation

## 2018-09-12 DIAGNOSIS — Z6841 Body Mass Index (BMI) 40.0 and over, adult: Secondary | ICD-10-CM | POA: Diagnosis not present

## 2018-09-12 DIAGNOSIS — D128 Benign neoplasm of rectum: Secondary | ICD-10-CM | POA: Diagnosis not present

## 2018-09-12 DIAGNOSIS — I10 Essential (primary) hypertension: Secondary | ICD-10-CM | POA: Diagnosis not present

## 2018-09-12 DIAGNOSIS — Z79899 Other long term (current) drug therapy: Secondary | ICD-10-CM | POA: Insufficient documentation

## 2018-09-12 DIAGNOSIS — Z1211 Encounter for screening for malignant neoplasm of colon: Secondary | ICD-10-CM | POA: Insufficient documentation

## 2018-09-12 DIAGNOSIS — K210 Gastro-esophageal reflux disease with esophagitis: Secondary | ICD-10-CM | POA: Insufficient documentation

## 2018-09-12 DIAGNOSIS — K640 First degree hemorrhoids: Secondary | ICD-10-CM | POA: Diagnosis not present

## 2018-09-12 DIAGNOSIS — I480 Paroxysmal atrial fibrillation: Secondary | ICD-10-CM | POA: Insufficient documentation

## 2018-09-12 DIAGNOSIS — Z87891 Personal history of nicotine dependence: Secondary | ICD-10-CM | POA: Diagnosis not present

## 2018-09-12 DIAGNOSIS — R1310 Dysphagia, unspecified: Secondary | ICD-10-CM | POA: Insufficient documentation

## 2018-09-12 HISTORY — PX: ESOPHAGOGASTRODUODENOSCOPY (EGD) WITH PROPOFOL: SHX5813

## 2018-09-12 HISTORY — PX: COLONOSCOPY WITH PROPOFOL: SHX5780

## 2018-09-12 HISTORY — DX: Cardiac arrhythmia, unspecified: I49.9

## 2018-09-12 SURGERY — ESOPHAGOGASTRODUODENOSCOPY (EGD) WITH PROPOFOL
Anesthesia: General

## 2018-09-12 MED ORDER — MIDAZOLAM HCL 2 MG/2ML IJ SOLN
INTRAMUSCULAR | Status: AC
  Filled 2018-09-12: qty 2

## 2018-09-12 MED ORDER — PROPOFOL 10 MG/ML IV BOLUS
INTRAVENOUS | Status: DC | PRN
  Administered 2018-09-12 (×3): 20 mg via INTRAVENOUS
  Administered 2018-09-12: 50 mg via INTRAVENOUS
  Administered 2018-09-12 (×7): 20 mg via INTRAVENOUS
  Administered 2018-09-12: 50 mg via INTRAVENOUS

## 2018-09-12 MED ORDER — MIDAZOLAM HCL 2 MG/2ML IJ SOLN
INTRAMUSCULAR | Status: DC | PRN
  Administered 2018-09-12: 2 mg via INTRAVENOUS

## 2018-09-12 MED ORDER — SODIUM CHLORIDE 0.9 % IV SOLN
INTRAVENOUS | Status: DC
  Administered 2018-09-12: 1000 mL via INTRAVENOUS

## 2018-09-12 NOTE — Op Note (Signed)
[facility] Gastroenterology Patient Name: Nathaniel Mcgee Procedure Date: 09/12/2018 10:22 AM MRN: [id number] Account #: [id number] Date of Birth: [birth year] Admit Type: Outpatient Age: 53 Room: [facility] ENDO ROOM 3 Gender: Male Note Status: Finalized Procedure:            Colonoscopy Indications:          Screening for colorectal malignant neoplasm Providers:            Benay Pike. Alice Reichert MD, MD Referring MD:         [name] (Referring MD) Medicines:            Propofol per Anesthesia Complications:        No immediate complications. Procedure:            Pre-Anesthesia Assessment:                       - The risks and benefits of the procedure and the                        sedation options and risks were discussed with the                        patient. All questions were answered and informed                        consent was obtained.                       - Patient identification and proposed procedure were                        verified prior to the procedure by the nurse. The                        procedure was verified in the procedure room.                       - ASA Grade Assessment: III - A patient with severe                        systemic disease.                       - After reviewing the risks and benefits, the patient                        was deemed in satisfactory condition to undergo the                        procedure.                       After obtaining informed consent, the colonoscope was                        passed under direct vision. Throughout the procedure,                        the patient's blood pressure, pulse, and oxygen  saturations were monitored continuously. The                        Colonoscope was introduced through the anus and                        advanced to the the cecum, identified by appendiceal                        orifice and ileocecal valve. The colonoscopy was         performed without difficulty. The patient tolerated the                        procedure well. The quality of the bowel preparation                        was excellent. The ileocecal valve, appendiceal                        orifice, and rectum were photographed. Findings:      The perianal and digital rectal examinations were normal. Pertinent       negatives include normal sphincter tone and no palpable rectal lesions.      A 5 mm polyp was found in the sigmoid colon. The polyp was sessile. The       polyp was removed with a jumbo cold forceps. Resection and retrieval       were complete.      A 8 mm polyp was found in the rectum. The polyp was semi-pedunculated.       The polyp was removed with a cold snare. Resection and retrieval were       complete. To prevent bleeding after the polypectomy, one hemostatic clip       was successfully placed (MR conditional). There was no bleeding at the       end of the procedure.      A few small-mouthed diverticula were found in the sigmoid colon.      Non-bleeding internal hemorrhoids were found during retroflexion. The       hemorrhoids were Grade I (internal hemorrhoids that do not prolapse).      The exam was otherwise without abnormality. Impression:           - One 5 mm polyp in the sigmoid colon, removed with a                        jumbo cold forceps. Resected and retrieved.                       - One 8 mm polyp in the rectum, removed with a cold                        snare. Resected and retrieved. Clip (MR conditional)                        was placed.                       - Diverticulosis in the sigmoid colon.                       -  Non-bleeding internal hemorrhoids.                       - The examination was otherwise normal. Recommendation:       - Await pathology results from EGD, also performed                        today.                       - Monitor results to esophageal dilation                       - Patient  has a contact number available for                        emergencies. The signs and symptoms of potential                        delayed complications were discussed with the patient.                        Return to normal activities tomorrow. Written discharge                        instructions were provided to the patient.                       - Resume previous diet.                       - Continue present medications.                       - Await pathology results.                       - Repeat colonoscopy is recommended for surveillance.                        The colonoscopy date will be determined after pathology                        results from today's exam become available for review.                       - Return to physician assistant in 6 weeks.                       - The findings and recommendations were discussed with                        the patient and their family. Procedure Code(s):    --- Professional ---                       [phone], Colonoscopy, flexible; with removal of tumor(s),                        polyp(s), or other lesion(s) by snare technique                       85885, 50, Colonoscopy, flexible; with biopsy,  single                        or multiple Diagnosis Code(s):    --- Professional ---                       K57.30, Diverticulosis of large intestine without                        perforation or abscess without bleeding                       K64.0, First degree hemorrhoids                       K62.1, Rectal polyp                       D12.5, Benign neoplasm of sigmoid colon                       Z12.11, Encounter for screening for malignant neoplasm                        of colon CPT copyright 2018 American Medical Association. All rights reserved. The codes documented in this report are preliminary and upon coder review may  be revised to meet current compliance requirements. Efrain Sella MD, MD 09/12/2018 11:12:51 AM This report  has been signed electronically. Number of Addenda: 0 Note Initiated On: 09/12/2018 10:22 AM Scope Withdrawal Time: 0 hours 6 minutes 48 seconds  Total Procedure Duration: 0 hours 13 minutes 11 seconds       [facility]

## 2018-09-12 NOTE — Anesthesia Postprocedure Evaluation (Signed)
Anesthesia Post Note  Patient: Nathaniel Mcgee  Procedure(s) Performed: ESOPHAGOGASTRODUODENOSCOPY (EGD) WITH PROPOFOL (N/A ) COLONOSCOPY WITH PROPOFOL (N/A )  Anesthesia Type: General     Last Vitals:  Vitals:   09/12/18 1140 09/12/18 1150  BP: 101/75 118/76  Pulse: 66 (!) 57  Resp: 11 18  Temp:    SpO2: 98% 96%    Last Pain:  Vitals:   09/12/18 0948  TempSrc: Oral  PainSc: 0-No pain                 Carolann Brazell S

## 2018-09-12 NOTE — H&P (Signed)
  Outpatient short stay form Pre-procedure 09/12/2018 10:21 AM Francia Verry K. Alice Reichert, M.D.  Primary Physician: Charlott Holler, NP  Reason for visit:  Colon cancer screening, Epigastric pain, GERD  History of present illness: 53 year old male presents with a history of GERD along with recent onset of epigastric pain.  Patient has minimal dysphagia to solids none to liquids. Patient presents for colon cancer screening. The patient denies abdominal pain, abnormal weight loss or rectal bleeding.      Current Facility-Administered Medications:  .  0.9 %  sodium chloride infusion, , Intravenous, Continuous, [place], Benay Pike, MD, Last Rate: 20 mL/hr at 09/12/18 1002, 1,000 mL at 09/12/18 1002  Medications Prior to Admission  Medication Sig Dispense Refill Last Dose  . pantoprazole (PROTONIX) 40 MG tablet Take 40 mg by mouth daily.     Marland Kitchen aspirin EC 81 MG tablet Take 1 tablet (81 mg total) by mouth daily. (Patient taking differently: Take 81 mg by mouth as needed. ) 90 tablet 3 Taking  . chlorzoxazone (PARAFON) 500 MG tablet Take 1 tablet by mouth 3 (three) times daily as needed.    Taking  . metoprolol tartrate (LOPRESSOR) 50 MG tablet Take 1 tablet (50 mg total) by mouth 2 (two) times daily. 180 tablet 3   . Multiple Vitamin (MULTIVITAMIN) tablet Take 1 tablet by mouth daily.   Taking  . sildenafil (VIAGRA) 50 MG tablet Take 1 tablet (50 mg total) by mouth daily as needed for erectile dysfunction. 10 tablet 1   . sucralfate (CARAFATE) 1 g tablet Take 1 g by mouth daily as needed (stomach problems).   Taking     Allergies  Allergen Reactions  . Sulfa Antibiotics Other (See Comments)    "feels like my bones were burning"     Past Medical History:  Diagnosis Date  . Atypical chest pain    a. In setting of Afib in the past;  b. 03/2016 ETT: poor ex tolerance w/ hypertensive response and 18mm upslopting inflat ST dep;  c. 05/2016 Cardiac CTA: Ca2+ score = 0. Nl right dominant cors.  . Claustrophobia    . Dysrhythmia    A-fib  . GERD (gastroesophageal reflux disease)   . Hypertension   . Morbid obesity ([place])   . PAF (paroxysmal atrial fibrillation) ([place]) 04/01/2016   a. 03/2016 Afib dx-->echo: EF 50-55%, no rwma;  b. CHA2DSVASc = 1-->ASA only.  . Panic attacks   . Snores    a. Had sleep study but had trouble sleeping.  Doesn't think he'd tolerate CPAP/nasal pillow on face anyway.    Review of systems:  Otherwise negative.    Physical Exam  Gen: Alert, oriented. Appears stated age.  HEENT: [place]/AT. PERRLA. Lungs: CTA, no wheezes. CV: RR nl S1, S2. Abd: soft, benign, no masses. BS+ Ext: No edema. Pulses 2+    Planned procedures: Proceed with EGD and colonoscopy. The patient understands the nature of the planned procedure, indications, risks, alternatives and potential complications including but not limited to bleeding, infection, perforation, damage to internal organs and possible oversedation/side effects from anesthesia. The patient agrees and gives consent to proceed.  Please refer to procedure notes for findings, recommendations and patient disposition/instructions.     Mykaila Blunck K. Alice Reichert, M.D. Gastroenterology 09/12/2018  10:21 AM

## 2018-09-12 NOTE — Op Note (Signed)
[facility] Gastroenterology Patient Name: Nathaniel Mcgee Procedure Date: 09/12/2018 10:23 AM MRN: [id number] Account #: [id number] Date of Birth: [birth year] Admit Type: Outpatient Age: 53 Room: [facility] ENDO ROOM 3 Gender: Male Note Status: Finalized Procedure:            Upper GI endoscopy Indications:          Epigastric abdominal pain, Dysphagia, Suspected                        esophageal reflux Providers:            Benay Pike. Alice Reichert MD, MD Referring MD:         [name] (Referring MD) Medicines:            Propofol per Anesthesia Complications:        No immediate complications. Procedure:            Pre-Anesthesia Assessment:                       - The risks and benefits of the procedure and the                        sedation options and risks were discussed with the                        patient. All questions were answered and informed                        consent was obtained.                       - Patient identification and proposed procedure were                        verified prior to the procedure by the nurse. The                        procedure was verified in the procedure room.                       - ASA Grade Assessment: III - A patient with severe                        systemic disease.                       - After reviewing the risks and benefits, the patient                        was deemed in satisfactory condition to undergo the                        procedure.                       After obtaining informed consent, the endoscope was                        passed under direct vision. Throughout the procedure,  the patient's blood pressure, pulse, and oxygen                        saturations were monitored continuously. The Endoscope                        was introduced through the mouth, and advanced to the                        third part of duodenum. The upper GI endoscopy was   accomplished without difficulty. The patient tolerated                        the procedure well. Findings:      The Z-line was irregular and was found in the distal esophagus. Mucosa       was biopsied with a cold forceps for histology. One specimen bottle was       sent to pathology.      There is no endoscopic evidence of hiatal hernia, inflammation, stenosis       or stricture in the entire esophagus.      Diffuse mildly erythematous mucosa without bleeding was found in the       gastric body and in the gastric antrum. Biopsies were taken with a cold       forceps for Helicobacter pylori testing.      The cardia and gastric fundus were normal on retroflexion.      The examined duodenum was normal.      The scope was withdrawn. Dilation was performed in the distal esophagus       with a Maloney dilator with no resistance at 10 Fr.      The exam was otherwise without abnormality. Impression:           - Z-line irregular, in the distal esophagus. Biopsied.                       - Erythematous mucosa in the gastric body and antrum.                        Biopsied.                       - Normal examined duodenum.                       - The examination was otherwise normal.                       - Dilation performed in the distal esophagus. Recommendation:       - Await pathology results from EGD, also performed                        today.                       - Monitor results to esophageal dilation                       - Proceed with colonoscopy Procedure Code(s):    --- Professional ---                       [phone], Esophagogastroduodenoscopy,  flexible, transoral;                        with biopsy, single or multiple                       43450, Dilation of esophagus, by unguided sound or                        bougie, single or multiple passes Diagnosis Code(s):    --- Professional ---                       R13.10, Dysphagia, unspecified                       R10.13,  Epigastric pain                       K31.89, Other diseases of stomach and duodenum                       K22.8, Other specified diseases of esophagus CPT copyright 2018 American Medical Association. All rights reserved. The codes documented in this report are preliminary and upon coder review may  be revised to meet current compliance requirements. Efrain Sella MD, MD 09/12/2018 10:50:25 AM This report has been signed electronically. Number of Addenda: 0 Note Initiated On: 09/12/2018 10:23 AM      [facility]

## 2018-09-12 NOTE — Anesthesia Post-op Follow-up Note (Signed)
Anesthesia QCDR form completed.        

## 2018-09-12 NOTE — Transfer of Care (Signed)
Immediate Anesthesia Transfer of Care Note  Patient: Nathaniel Mcgee  Procedure(s) Performed: ESOPHAGOGASTRODUODENOSCOPY (EGD) WITH PROPOFOL (N/A ) COLONOSCOPY WITH PROPOFOL (N/A )  Patient Location: Endoscopy Unit  Anesthesia Type:General  Level of Consciousness: awake, alert , oriented and patient cooperative  Airway & Oxygen Therapy: Patient Spontanous Breathing  Post-op Assessment: Report given to RN and Post -op Vital signs reviewed and stable  Post vital signs: Reviewed and stable  Last Vitals:  Vitals Value Taken Time  BP 112/71 09/12/2018 11:20 AM  Temp 36.8 C 09/12/2018 11:20 AM  Pulse 77 09/12/2018 11:22 AM  Resp 14 09/12/2018 11:22 AM  SpO2 95 % 09/12/2018 11:22 AM  Vitals shown include unvalidated device data.  Last Pain:  Vitals:   09/12/18 0948  TempSrc: Oral  PainSc: 0-No pain         Complications: No apparent anesthesia complications

## 2018-09-12 NOTE — Anesthesia Preprocedure Evaluation (Signed)
Anesthesia Evaluation  Patient identified by MRN, date of birth, ID band Patient awake    Reviewed: Allergy & Precautions, NPO status , Patient's Chart, lab work & pertinent test results, reviewed documented beta blocker date and time   Airway Mallampati: III  TM Distance: >3 FB     Dental  (+) Chipped   Pulmonary sleep apnea , former smoker,           Cardiovascular hypertension, Pt. on medications and Pt. on home beta blockers + dysrhythmias Atrial Fibrillation      Neuro/Psych Anxiety    GI/Hepatic   Endo/Other    Renal/GU      Musculoskeletal   Abdominal   Peds  Hematology   Anesthesia Other Findings Obese. EF 50-55.  Reproductive/Obstetrics                             Anesthesia Physical Anesthesia Plan  ASA: III  Anesthesia Plan: General   Post-op Pain Management:    Induction: Intravenous  PONV Risk Score and Plan:   Airway Management Planned:   Additional Equipment:   Intra-op Plan:   Post-operative Plan:   Informed Consent: I have reviewed the patients History and Physical, chart, labs and discussed the procedure including the risks, benefits and alternatives for the proposed anesthesia with the patient or authorized representative who has indicated his/her understanding and acceptance.       Plan Discussed with: CRNA  Anesthesia Plan Comments:         Anesthesia Quick Evaluation

## 2018-09-14 LAB — SURGICAL PATHOLOGY

## 2018-09-21 ENCOUNTER — Telehealth: Payer: Self-pay | Admitting: *Deleted

## 2018-09-21 NOTE — Telephone Encounter (Signed)
I spoke with pt concerning overdue 1 yr f/u appointment.  Pt agreed to telephone visit/office visit. Pt prefers Fridays due to schedule conflicts. Fwd to scheduling to contact for future appointment.  Pt mentioned that he hasn't been taking his BP medications as requested by MD due to him running out of Rx refills. Pt has been stretching his medication to make it last. Pt mentioned that he hasn't even noticed it effecting his BP/ or need of needing the BP medication. Pt wanted to know if he should continue medication or d/c please advise.

## 2018-09-25 NOTE — Telephone Encounter (Signed)
Called patient to check and see if he needed any refills. States he has enough meds for now.     Virtual Visit Pre-Appointment Phone Call  Steps For Call:  1. Confirm consent - "In the setting of the current Covid19 crisis, you are scheduled for a (phone or video) visit with your provider on (date) at (time).  Just as we do with many in-office visits, in order for you to participate in this visit, we must obtain consent.  If you'd like, I can send this to your mychart (if signed up) or email for you to review.  Otherwise, I can obtain your verbal consent now.  All virtual visits are billed to your insurance company just like a normal visit would be.  By agreeing to a virtual visit, we'd like you to understand that the technology does not allow for your provider to perform an examination, and thus may limit your provider's ability to fully assess your condition.  Finally, though the technology is pretty good, we cannot assure that it will always work on either your or our end, and in the setting of a video visit, we may have to convert it to a phone-only visit.  In either situation, we cannot ensure that we have a secure connection.  Are you willing to proceed?"  2. Give patient instructions for WebEx download to smartphone as below if video visit  3. Advise patient to be prepared with any vital sign or heart rhythm information, their current medicines, and a piece of paper and pen handy for any instructions they may receive the day of their visit  4. Inform patient they will receive a phone call 15 minutes prior to their appointment time (may be from unknown caller ID) so they should be prepared to answer  5. Confirm that appointment type is correct in Epic appointment notes (video vs telephone)    TELEPHONE CALL NOTE  Nathaniel Mcgee has been deemed a candidate for a follow-up tele-health visit to limit community exposure during the Covid-19 pandemic. I spoke with the patient via phone to ensure  availability of phone/video source, confirm preferred email & phone number, and discuss instructions and expectations.  I reminded Nathaniel Mcgee to be prepared with any vital sign and/or heart rhythm information that could potentially be obtained via home monitoring, at the time of his visit. I reminded Nathaniel Mcgee to expect a phone call at the time of his visit if his visit.  Did the patient verbally acknowledge consent to treatment? yes  Roney Jaffe, RN 09/25/2018 9:56 AM   DOWNLOADING THE [place] TO SMARTPHONE  - If Apple, go to [employer] and type in WebEx in the search bar. [employer], the blue/green circle. The app is free but as with any other app downloads, their phone may require them to verify saved payment information or Apple password. The patient does NOT have to create an account.  - If Android, ask patient to go to [employer] and type in WebEx in the search bar. [employer], the blue/green circle. The app is free but as with any other app downloads, their phone may require them to verify saved payment information or Android password. The patient does NOT have to create an account.   CONSENT FOR TELE-HEALTH VISIT - PLEASE REVIEW  I hereby voluntarily request, consent and authorize [facility] and its employed or contracted physicians, Engineer, materials, nurse practitioners or other licensed health care  professionals (the Practitioner), to provide me with telemedicine health care services (the "Services") as deemed necessary by the treating Practitioner. I acknowledge and consent to receive the Services by the Practitioner via telemedicine. I understand that the telemedicine visit will involve communicating with the Practitioner through live audiovisual communication technology and the disclosure of certain medical information by electronic transmission. I acknowledge that I have been given the opportunity to request an  in-person assessment or other available alternative prior to the telemedicine visit and am voluntarily participating in the telemedicine visit.  I understand that I have the right to withhold or withdraw my consent to the use of telemedicine in the course of my care at any time, without affecting my right to future care or treatment, and that the Practitioner or I may terminate the telemedicine visit at any time. I understand that I have the right to inspect all information obtained and/or recorded in the course of the telemedicine visit and may receive copies of available information for a reasonable fee.  I understand that some of the potential risks of receiving the Services via telemedicine include:  Nathaniel Mcgee Delay or interruption in medical evaluation due to technological equipment failure or disruption; . Information transmitted may not be sufficient (e.g. poor resolution of images) to allow for appropriate medical decision making by the Practitioner; and/or  . In rare instances, security protocols could fail, causing a breach of personal health information.  Furthermore, I acknowledge that it is my responsibility to provide information about my medical history, conditions and care that is complete and accurate to the best of my ability. I acknowledge that Practitioner's advice, recommendations, and/or decision may be based on factors not within their control, such as incomplete or inaccurate data provided by me or distortions of diagnostic images or specimens that may result from electronic transmissions. I understand that the practice of medicine is not an exact science and that Practitioner makes no warranties or guarantees regarding treatment outcomes. I acknowledge that I will receive a copy of this consent concurrently upon execution via email to the email address I last provided but may also request a printed copy by calling the office of [facility].    I understand that my insurance will be  billed for this visit.   I have read or had this consent read to me. . I understand the contents of this consent, which adequately explains the benefits and risks of the Services being provided via telemedicine.  . I have been provided ample opportunity to ask questions regarding this consent and the Services and have had my questions answered to my satisfaction. . I give my informed consent for the services to be provided through the use of telemedicine in my medical care  By participating in this telemedicine visit I agree to the above.

## 2018-10-04 NOTE — Progress Notes (Signed)
Virtual Visit via Video Note   This visit type was conducted due to national recommendations for restrictions regarding the COVID-19 Pandemic (e.g. social distancing) in an effort to limit this patient's exposure and mitigate transmission in our community.  Due to his co-morbid illnesses, this patient is at least at moderate risk for complications without adequate follow up.  This format is felt to be most appropriate for this patient at this time.  All issues noted in this document were discussed and addressed.  A limited physical exam was performed with this format.  Please refer to the patient's chart for his consent to telehealth for [facility].   Evaluation Performed:  Follow-up visit  Date:  10/05/2018   ID:  Nathaniel Mcgee, DOB 11/27/1965, MRN [id number]  Patient Location: Home  Provider Location: Office  PCP:  [name], NP  Cardiologist:  Nelva Bush, MD  Electrophysiologist:  None   Chief Complaint: Follow-up paroxysmal atrial fibrillation  History of Present Illness:    Nathaniel Mcgee is a 53 y.o. male who presents via audio/video conferencing for a telehealth visit today.  He has a history of paroxysmal atrial fibrillation, hypertension, atypical chest pain with normal coronaries by cardiac CTA, claustrophobia, GERD, and morbid obesity.  He was last seen in our office in 03/2017 by [name], NP, at which time he was doing well other than worsening erectile dysfunction and mild fatigue.  Transitioning from metoprolol to bisoprolol or diltiazem was discussed, but Nathaniel Mcgee wished to defer medication changes.  Nathaniel Mcgee also declined referral for sleep study.  Today, Nathaniel Mcgee reports that he is feeling relatively well.  He almost ran out of metoprolol a few months ago and has only been using his remaining supply intermittently for palpitations.  He notes improvement in his energy since coming off standing metoprolol.  He occasionally gets a feeling of an "adrenaline  rush" that lasts about 30 minutes.  He will take the metoprolol and notice gradual improvement in the symptoms.  He usually checks his heart rate and rhythm using his phone and notes that it is around 80 to 90 bpm, regardless of if he feels well or is having one of his "adrenaline rushes."  He has not had shortness of breath, lightheadedness, or edema.  He notes occasional mild chest tightness when he gets anxious, which has been longstanding and is unchanged.  Nathaniel Mcgee underwent screening colonoscopy earlier this year and was told by the anesthesiologist that he likely has severe sleep apnea.  Nathaniel Mcgee notes that he previously underwent polysomnography, though the test had to be aborted due to his claustrophobia.  He does not think that he would tolerate repeat sleep study or CPAP use.  Nathaniel Mcgee notes that his erectile dysfunction improved after discontinuation of standing metoprolol.  He used sildenafil once and felt like it provided slight benefit.  The patient does not have symptoms concerning for COVID-19 infection (fever, chills, cough, or new shortness of breath).    Past Medical History:  Diagnosis Date  . Atypical chest pain    a. In setting of Afib in the past;  b. 03/2016 ETT: poor ex tolerance w/ hypertensive response and 25mm upslopting inflat ST dep;  c. 05/2016 Cardiac CTA: Ca2+ score = 0. Nl right dominant cors.  . Claustrophobia   . Dysrhythmia    A-fib  . GERD (gastroesophageal reflux disease)   . Hypertension   . Morbid obesity ([place])   . PAF (paroxysmal atrial  fibrillation) ([place]) 04/01/2016   a. 03/2016 Afib dx-->echo: EF 50-55%, no rwma;  b. CHA2DSVASc = 1-->ASA only.  . Panic attacks   . Snores    a. Had sleep study but had trouble sleeping.  Doesn't think he'd tolerate CPAP/nasal pillow on face anyway.   Past Surgical History:  Procedure Laterality Date  . COLONOSCOPY WITH PROPOFOL N/A 09/12/2018   Procedure: COLONOSCOPY WITH PROPOFOL;  Surgeon: Toledo, Benay Pike, MD;  Location: [facility] ENDOSCOPY;  Service: Gastroenterology;  Laterality: N/A;  . ESOPHAGOGASTRODUODENOSCOPY    . ESOPHAGOGASTRODUODENOSCOPY (EGD) WITH PROPOFOL N/A 09/12/2018   Procedure: ESOPHAGOGASTRODUODENOSCOPY (EGD) WITH PROPOFOL;  Surgeon: Toledo, Benay Pike, MD;  Location: [facility] ENDOSCOPY;  Service: Gastroenterology;  Laterality: N/A;  . KNEE ARTHROSCOPY  1998   right knee  . NASAL SINUS SURGERY  1996     Current Meds  Medication Sig  . aspirin EC 81 MG tablet Take 1 tablet (81 mg total) by mouth daily.  . chlorzoxazone (PARAFON) 500 MG tablet Take 1 tablet by mouth 3 (three) times daily as needed.   . Multiple Vitamin (MULTIVITAMIN) tablet Take 1 tablet by mouth daily.  . pantoprazole (PROTONIX) 40 MG tablet Take 40 mg by mouth daily.  . sildenafil (VIAGRA) 50 MG tablet Take 1 tablet (50 mg total) by mouth daily as needed for erectile dysfunction.  . sucralfate (CARAFATE) 1 g tablet Take 1 g by mouth daily as needed (stomach problems).  . [DISCONTINUED] aspirin EC 81 MG tablet Take 1 tablet (81 mg total) by mouth daily. (Patient taking differently: Take 81 mg by mouth daily as needed. )     Allergies:   Sulfa antibiotics   Social History   Tobacco Use  . Smoking status: Former Smoker    Types: Cigarettes    Last attempt to quit: 1987    Years since quitting: 33.2  . Smokeless tobacco: Current User    Types: Snuff  Substance Use Topics  . Alcohol use: No  . Drug use: No     Family Hx: The patient's family history includes Cancer (age of onset: 42) in his father.  ROS:   Please see the history of present illness.   All other systems reviewed and are negative.   Prior CV studies:   The following studies were reviewed today:  Coronary CTA (06/21/2016): Normal coronary arteries without stenosis.  Coronary calcium score is 0.  Incidental note made of hepatic steatosis.  Exercise tolerance test 04/12/2016): Intermediate probability stress test with 1 mm upsloping ST  depression in the inferolateral leads and low exercise capacity.  TTE (04/02/2016): Technically difficult study.  Normal LV size.  LVEF 50-55% with normal wall motion.  Labs/Other Tests and Data Reviewed:    EKG:  No ECG reviewed.  Recent Labs: No results found for requested labs within last 8760 hours.   Recent Lipid Panel Lab Results  Component Value Date/Time   CHOL 173 06/13/2016 09:02 AM   TRIG 88 06/13/2016 09:02 AM   HDL 50 06/13/2016 09:02 AM   CHOLHDL 3.5 06/13/2016 09:02 AM   LDLCALC 105 (H) 06/13/2016 09:02 AM    Wt Readings from Last 3 Encounters:  10/05/18 (!) 347 lb (157.4 kg)  09/12/18 (!) 345 lb (156.5 kg)  03/28/17 (!) 361 lb 8 oz (164 kg)     Objective:    Vital Signs:  BP 133/84 (BP Location: Left Arm, Patient Position: Sitting, Cuff Size: Large)   Pulse 72   Ht 6\' 4"  (1.93 m)  Wt (!) 347 lb (157.4 kg)   BMI 42.24 kg/m    Obese man, seated comfortably in his home.  ASSESSMENT & PLAN:    Paroxysmal atrial fibrillation: Nathaniel Mcgee notes occasional "adrenaline rush" sensations, which he feels are different than what he experienced in the past with atrial fibrillation.  It is hard to know if these could represent transient arrhythmias.  Sensation seems to respond to metoprolol, though overall Nathaniel Mcgee feels better since discontinuation of standing metoprolol.  We will therefore try diltiazem 180 mg daily.  Given CHADSVASC score of 1, we will continue with low-dose aspirin for stroke prophylaxis.  Erectile dysfunction: Somewhat improved with discontinuation of metoprolol.  Hopefully, this will continue.  Nathaniel Mcgee can use sildenafil as needed should he have worsening ED in the future.  Noncardiac chest pain: Intermittent chest pain with anxiety unchanged.  Cardiac CTA in 2017 showed no CAD.  No further work-up or intervention at this time.  Morbid obesity: Nathaniel Mcgee is trying to lose weight, though he remains morbidly obese.  Importance of lifestyle  modifications, including dietary changes and increased activity were stressed.  Given that he likely has severe sleep apnea but is unable to be treated due to his profound claustrophobia, weight loss may be the most beneficial treatment for him.  COVID-19 Education: The signs and symptoms of COVID-19 were discussed with the patient and how to seek care for testing (follow up with PCP or arrange E-visit).  The importance of social distancing was discussed today.  Time:   Today, I have spent 15 minutes with the patient with telehealth technology discussing the above problems.     Medication Adjustments/Labs and Tests Ordered: Current medicines are reviewed at length with the patient today.  Concerns regarding medicines are outlined above.   Tests Ordered: None.  Medication Changes: Meds ordered this encounter  Medications  . aspirin EC 81 MG tablet    Sig: Take 1 tablet (81 mg total) by mouth daily.  Marland Kitchen diltiazem (CARDIZEM CD) 180 MG 24 hr capsule    Sig: Take 1 capsule (180 mg total) by mouth daily.    Dispense:  90 capsule    Refill:  3    Disposition:  Follow up in 4 month(s)  Signed, Nelva Bush, MD  10/05/2018 10:58 AM    [facility]

## 2018-10-05 ENCOUNTER — Encounter: Payer: Self-pay | Admitting: Internal Medicine

## 2018-10-05 ENCOUNTER — Telehealth (INDEPENDENT_AMBULATORY_CARE_PROVIDER_SITE_OTHER): Payer: Managed Care, Other (non HMO) | Admitting: Internal Medicine

## 2018-10-05 ENCOUNTER — Other Ambulatory Visit: Payer: Self-pay

## 2018-10-05 VITALS — BP 133/84 | HR 72 | Ht 76.0 in | Wt 347.0 lb

## 2018-10-05 DIAGNOSIS — N529 Male erectile dysfunction, unspecified: Secondary | ICD-10-CM

## 2018-10-05 DIAGNOSIS — R079 Chest pain, unspecified: Secondary | ICD-10-CM

## 2018-10-05 DIAGNOSIS — I480 Paroxysmal atrial fibrillation: Secondary | ICD-10-CM

## 2018-10-05 DIAGNOSIS — Z7189 Other specified counseling: Secondary | ICD-10-CM

## 2018-10-05 MED ORDER — DILTIAZEM HCL ER COATED BEADS 180 MG PO CP24: 180.0000 mg | ORAL_CAPSULE | Freq: Every day | ORAL | 3 refills | Status: DC

## 2018-10-05 MED ORDER — ASPIRIN EC 81 MG PO TBEC: 81.0000 mg | DELAYED_RELEASE_TABLET | Freq: Every day | ORAL | Status: DC

## 2018-10-05 NOTE — Patient Instructions (Signed)
Medication Instructions:  Your physician has recommended you make the following change in your medication:  1) STOP Metoprolol 2) START Aspirin 81mg  daily 3) START Diltiazem 180mg  daily. An Rx has been sent to your pharmacy  If you need a refill on your cardiac medications before your next appointment, please call your pharmacy.   Lab work: None ordered   Testing/Procedures: None ordered  Follow-Up: At [facility], you and your health needs are our priority.  As part of our continuing mission to provide you with exceptional heart care, we have created designated Provider Care Teams.  These Care Teams include your primary Cardiologist (physician) and Advanced Practice Providers (APPs -  Physician Assistants and Nurse Practitioners) who all work together to provide you with the care you need, when you need it.  Your physician recommends that you schedule a follow-up appointment in: 4 months with Dr.End or a APP

## 2019-04-09 ENCOUNTER — Other Ambulatory Visit: Payer: Self-pay | Admitting: *Deleted

## 2019-04-09 MED ORDER — DILTIAZEM HCL ER COATED BEADS 180 MG PO CP24: 180.0000 mg | ORAL_CAPSULE | Freq: Every day | ORAL | 0 refills | Status: DC

## 2019-05-28 ENCOUNTER — Other Ambulatory Visit: Payer: Self-pay | Admitting: Internal Medicine

## 2019-12-24 ENCOUNTER — Telehealth: Payer: Self-pay | Admitting: Internal Medicine

## 2019-12-24 NOTE — Telephone Encounter (Signed)
Patient has been contacted at least 3 times for a recall, recall has been deleted

## 2020-01-14 ENCOUNTER — Ambulatory Visit: Payer: Managed Care, Other (non HMO) | Admitting: Podiatry

## 2020-01-14 ENCOUNTER — Other Ambulatory Visit: Payer: Self-pay

## 2020-01-14 DIAGNOSIS — M7751 Other enthesopathy of right foot: Secondary | ICD-10-CM

## 2020-01-15 ENCOUNTER — Encounter: Payer: Self-pay | Admitting: Podiatry

## 2020-01-15 NOTE — Progress Notes (Signed)
Subjective:  Patient ID: Nathaniel Mcgee, male    DOB: 10/22/1965,  MRN: [id number]  Chief Complaint  Patient presents with   Foot Pain    pt is here for right foot pain of the right foot, toes 3-5, pain has been going on for 3-4 weeks, pt states that pain is elevated in the morning, and that it feels like he is walking on a pebble.    54 y.o. male presents with the above complaint.  Patient presents with right submetatarsal 2 pain that has been going on for the 3 to 4 weeks has progressive gotten worse.  Patient states that feels like his walking on a pebble patient states the pain is elevated every morning has progressively gotten worse.  He is not able to walk properly anymore.  He has not tried anything.  He has made some shoe gear modification with some over-the-counter orthotic but has not been seen by anyone else.  He denies any treatment options.  His pain scale is 7 out of 10.  Is dull achy in nature.   Review of Systems: Negative except as noted in the HPI. Denies N/V/F/Ch.  Past Medical History:  Diagnosis Date   Atypical chest pain    a. In setting of Afib in the past;  b. 03/2016 ETT: poor ex tolerance w/ hypertensive response and 82mm upslopting inflat ST dep;  c. 05/2016 Cardiac CTA: Ca2+ score = 0. Nl right dominant cors.   Claustrophobia    Dysrhythmia    A-fib   GERD (gastroesophageal reflux disease)    Hypertension    Morbid obesity ([facility])    PAF (paroxysmal atrial fibrillation) ([place]) 04/01/2016   a. 03/2016 Afib dx-->echo: EF 50-55%, no rwma;  b. CHA2DSVASc = 1-->ASA only.   Panic attacks    Snores    a. Had sleep study but had trouble sleeping.  Doesn't think he'd tolerate CPAP/nasal pillow on face anyway.    Current Outpatient Medications:    aspirin EC 81 MG tablet, Take 1 tablet (81 mg total) by mouth daily., Disp: , Rfl:    chlorzoxazone (PARAFON) 500 MG tablet, Take 1 tablet by mouth 3 (three) times daily as needed. , Disp: , Rfl:    diltiazem  (CARDIZEM CD) 180 MG 24 hr capsule, Take 1 capsule (180 mg total) by mouth daily., Disp: 90 capsule, Rfl: 0   Multiple Vitamin (MULTIVITAMIN) tablet, Take 1 tablet by mouth daily., Disp: , Rfl:    pantoprazole (PROTONIX) 40 MG tablet, Take 40 mg by mouth daily., Disp: , Rfl:    sildenafil (VIAGRA) 50 MG tablet, Take 1 tablet (50 mg total) by mouth daily as needed for erectile dysfunction., Disp: 10 tablet, Rfl: 1   sucralfate (CARAFATE) 1 g tablet, Take 1 g by mouth daily as needed (stomach problems)., Disp: , Rfl:   Social History   Tobacco Use  Smoking Status Former Smoker   Types: Cigarettes   Quit date: 1987   Years since quitting: 34.5  Smokeless Tobacco Current User   Types: Snuff    Allergies  Allergen Reactions   Sulfa Antibiotics Other (See Comments)    "feels like my bones were burning"   Objective:  There were no vitals filed for this visit. There is no height or weight on file to calculate BMI. Constitutional Well developed. Well nourished.  Vascular Dorsalis pedis pulses palpable bilaterally. Posterior tibial pulses palpable bilaterally. Capillary refill normal to all digits.  No cyanosis or clubbing noted. Pedal hair growth  normal.  Neurologic Normal speech. Oriented to person, place, and time. Epicritic sensation to light touch grossly present bilaterally.  Dermatologic Nails well groomed and normal in appearance. No open wounds. No skin lesions.  Orthopedic:  Pain on palpation to the right second metatarsophalangeal joint.  Pain with range of motion of the second MPJ.  No intra-articular pain noted.  The pain was consistent with both active and passive range of motion of the second MPJ.  Negative Mulder's click.  Unable to recreate the pain with lateral squeezing of the toes and palpation of the interspaces.   Radiographs: None Assessment:   1. Capsulitis of metatarsophalangeal (MTP) joint of right foot    Plan:  Patient was evaluated and treated  and all questions answered.  Right second MPJ capsulitis -I explained patient the etiology of capsulitis and various treatment options were extensively discussed..  This is likely due to his underlying foot structure what his pushing off of the second MPJ as opposed to the first metatarsophalangeal joint.  Given that patient also has negative Mulder's click as well as any other signs of neuroma disclosing capsulitis.  At this point I discussed the patient with the patient that she will benefit from a steroid injection patient agrees with the plan would like to proceed with a steroid injection -A steroid injection was performed at right second MPJ using 1% plain Lidocaine and 10 mg of Kenalog. This was well tolerated. -Metatarsal pad was dispensed  No follow-ups on file.

## 2020-02-11 ENCOUNTER — Other Ambulatory Visit: Payer: Self-pay

## 2020-02-11 ENCOUNTER — Encounter: Payer: Self-pay | Admitting: Podiatry

## 2020-02-11 ENCOUNTER — Ambulatory Visit: Payer: Managed Care, Other (non HMO) | Admitting: Podiatry

## 2020-02-11 DIAGNOSIS — M79674 Pain in right toe(s): Secondary | ICD-10-CM

## 2020-02-11 DIAGNOSIS — M79675 Pain in left toe(s): Secondary | ICD-10-CM

## 2020-02-11 DIAGNOSIS — M7751 Other enthesopathy of right foot: Secondary | ICD-10-CM

## 2020-02-11 DIAGNOSIS — B351 Tinea unguium: Secondary | ICD-10-CM | POA: Diagnosis not present

## 2020-02-11 NOTE — Progress Notes (Signed)
Subjective:  Patient ID: Nathaniel Mcgee, male    DOB: 07/24/1965,  MRN: [id number]  Chief Complaint  Patient presents with  . Foot Pain    "its a little sore after working all day, but alot better than it was"    54 y.o. male presents with the above complaint.  Patient presents with a follow-up of right submetatarsal 2 capsulitis.  Patient states his pain has completely resolved.  He does not have any further pain.  He states is 95% or better.  He has been somewhat utilizing metatarsal pads but has not been able to feel the shoes.  He has secondary complaint of thickened elongated dystrophic toenails x10.  He would like to have them debrided out as he is not able to do it himself.  He has not seen anyone else for the nails before see me.   Review of Systems: Negative except as noted in the HPI. Denies N/V/F/Ch.  Past Medical History:  Diagnosis Date  . Atypical chest pain    a. In setting of Afib in the past;  b. 03/2016 ETT: poor ex tolerance w/ hypertensive response and 23mm upslopting inflat ST dep;  c. 05/2016 Cardiac CTA: Ca2+ score = 0. Nl right dominant cors.  . Claustrophobia   . Dysrhythmia    A-fib  . GERD (gastroesophageal reflux disease)   . Hypertension   . Morbid obesity ([place])   . PAF (paroxysmal atrial fibrillation) ([place]) 04/01/2016   a. 03/2016 Afib dx-->echo: EF 50-55%, no rwma;  b. CHA2DSVASc = 1-->ASA only.  . Panic attacks   . Snores    a. Had sleep study but had trouble sleeping.  Doesn't think he'd tolerate CPAP/nasal pillow on face anyway.    Current Outpatient Medications:  .  aspirin EC 81 MG tablet, Take 1 tablet (81 mg total) by mouth daily., Disp: , Rfl:  .  chlorzoxazone (PARAFON) 500 MG tablet, Take 1 tablet by mouth 3 (three) times daily as needed. , Disp: , Rfl:  .  diltiazem (CARDIZEM CD) 180 MG 24 hr capsule, Take 1 capsule (180 mg total) by mouth daily., Disp: 90 capsule, Rfl: 0 .  Multiple Vitamin (MULTIVITAMIN) tablet, Take 1 tablet by mouth  daily., Disp: , Rfl:  .  pantoprazole (PROTONIX) 40 MG tablet, Take 40 mg by mouth daily., Disp: , Rfl:  .  sildenafil (VIAGRA) 50 MG tablet, Take 1 tablet (50 mg total) by mouth daily as needed for erectile dysfunction., Disp: 10 tablet, Rfl: 1 .  sucralfate (CARAFATE) 1 g tablet, Take 1 g by mouth daily as needed (stomach problems)., Disp: , Rfl:   Social History   Tobacco Use  Smoking Status Former Smoker  . Types: Cigarettes  . Quit date: 24  . Years since quitting: 34.6  Smokeless Tobacco Current User  . Types: Snuff    Allergies  Allergen Reactions  . Sulfa Antibiotics Other (See Comments)    "feels like my bones were burning"   Objective:  There were no vitals filed for this visit. There is no height or weight on file to calculate BMI. Constitutional Well developed. Well nourished.  Vascular Dorsalis pedis pulses palpable bilaterally. Posterior tibial pulses palpable bilaterally. Capillary refill normal to all digits.  No cyanosis or clubbing noted. Pedal hair growth normal.  Neurologic Normal speech. Oriented to person, place, and time. Epicritic sensation to light touch grossly present bilaterally.  Dermatologic  No open wounds. No skin lesions. Nail Exam: Pt has thick disfigured discolored nails  with subungual debris noted bilateral entire nail hallux through fifth toenails.  Pain on palpation to the nails.  Orthopedic:  No pain on palpation to the right second metatarsophalangeal joint.  No pain with range of motion of the second MPJ.  No intra-articular pain noted..  Negative Mulder's click.  Unable to recreate the pain with lateral squeezing of the toes and palpation of the interspaces.   Radiographs: None Assessment:   1. Pain due to onychomycosis of toenails of both feet   2. Capsulitis of metatarsophalangeal (MTP) joint of right foot    Plan:  Patient was evaluated and treated and all questions answered.  Right second MPJ capsulitis -Clinically pain  has resolved completely.  Given that there is a complete resolve meant I will hold off on any further injection at this time.  I discussed with the patient if any of the foot and ankle issues arise in the future come back and see me. -Continue using metatarsal pad.  Onychomycosis with pain  -Nails palliatively debrided as below. -Educated on self-care  Procedure: Nail Debridement Rationale: pain  Type of Debridement: manual, sharp debridement. Instrumentation: Nail nipper, rotary burr. Number of Nails: 10  Procedures and Treatment: Consent by patient was obtained for treatment procedures. The patient understood the discussion of treatment and procedures well. All questions were answered thoroughly reviewed. Debridement of mycotic and hypertrophic toenails, 1 through 5 bilateral and clearing of subungual debris. No ulceration, no infection noted.  Return Visit-Office Procedure: Patient instructed to return to the office for a follow up visit 3 months for continued evaluation and treatment.  Boneta Lucks, DPM    No follow-ups on file.   No follow-ups on file.

## 2020-11-02 ENCOUNTER — Other Ambulatory Visit: Payer: Self-pay

## 2020-11-02 DIAGNOSIS — N200 Calculus of kidney: Secondary | ICD-10-CM

## 2020-11-03 ENCOUNTER — Telehealth: Payer: Self-pay

## 2020-11-03 ENCOUNTER — Ambulatory Visit: Payer: Managed Care, Other (non HMO) | Admitting: Urology

## 2020-11-03 ENCOUNTER — Encounter: Payer: Self-pay | Admitting: Urology

## 2020-11-03 ENCOUNTER — Ambulatory Visit
Admission: RE | Admit: 2020-11-03 | Discharge: 2020-11-03 | Disposition: A | Discharge status: Home / Self Care | Payer: Managed Care, Other (non HMO) | Source: Ambulatory Visit | Attending: Urology | Admitting: Urology

## 2020-11-03 ENCOUNTER — Other Ambulatory Visit: Payer: Self-pay

## 2020-11-03 ENCOUNTER — Other Ambulatory Visit
Admission: RE | Admit: 2020-11-03 | Discharge: 2020-11-03 | Disposition: A | Discharge status: Home / Self Care | Payer: Managed Care, Other (non HMO) | Source: Home / Self Care | Attending: Urology | Admitting: Urology

## 2020-11-03 VITALS — BP 138/84 | HR 84 | Temp 98.7°F | Ht 76.0 in | Wt 321.0 lb

## 2020-11-03 DIAGNOSIS — N200 Calculus of kidney: Secondary | ICD-10-CM | POA: Insufficient documentation

## 2020-11-03 DIAGNOSIS — R1012 Left upper quadrant pain: Secondary | ICD-10-CM

## 2020-11-03 DIAGNOSIS — N201 Calculus of ureter: Secondary | ICD-10-CM

## 2020-11-03 LAB — URINALYSIS, COMPLETE (UACMP) WITH MICROSCOPIC
Bilirubin Urine: NEGATIVE
Glucose, UA: NEGATIVE mg/dL
Ketones, ur: NEGATIVE mg/dL
Nitrite: NEGATIVE
Protein, ur: NEGATIVE mg/dL
Specific Gravity, Urine: 1.015 (ref 1.005–1.030)
WBC, UA: >50 WBC/hpf (ref 0–5)
pH: 6 (ref 5.0–8.0)

## 2020-11-03 MED ORDER — CIPROFLOXACIN HCL 500 MG PO TABS: 500.0000 mg | ORAL_TABLET | Freq: Two times a day (BID) | ORAL | 0 refills | Status: DC

## 2020-11-03 MED ORDER — CIPROFLOXACIN HCL 500 MG PO TABS
500.0000 mg | ORAL_TABLET | Freq: Once | ORAL | Status: AC
  Administered 2020-11-03: 500 mg via ORAL

## 2020-11-03 NOTE — Telephone Encounter (Signed)
please add for left ureteroscopy, laser lithotripsy, stent placement this Friday for 66mm proximal ureteral stone. Ancef for abx. He is on cipro now for UTI.   orders placed, open case(blank order entered into the depo unable to enter in description) entered in [place] for 11-06-20 @16 :00

## 2020-11-03 NOTE — H&P (View-Only) (Signed)
11/03/20 9:35 AM   Nathaniel Mcgee [birth year] [id number]  CC: Left flank pain   HPI: I saw Mr. Mckoy in clinic today for evaluation of left flank pain and possible kidney stone.  He reports 1 week of intermittent left-sided flank pain that radiates to the left groin.  He was seen in urgent care at [facility] and was found to have microscopic hematuria, but no imaging was performed, and they recommended urology follow-up for a possible kidney stone.  His pain has been intermittent since that time, but is well controlled with NSAIDs.  He denies any prior stone episodes.  He denies any fevers, chills, dysuria, urgency/frequency.  He denies any history of UTIs.  Urinalysis today with many bacteria, 6-10 RBCs, 0-5 squamous cells, greater than 50 WBCs, small leukocytes, nitrite negative.   PMH: Past Medical History:  Diagnosis Date  . Atypical chest pain    a. In setting of Afib in the past;  b. 03/2016 ETT: poor ex tolerance w/ hypertensive response and 46mm upslopting inflat ST dep;  c. 05/2016 Cardiac CTA: Ca2+ score = 0. Nl right dominant cors.  . Claustrophobia   . Dysrhythmia    A-fib  . GERD (gastroesophageal reflux disease)   . Hypertension   . Morbid obesity ([place])   . PAF (paroxysmal atrial fibrillation) ([place]) 04/01/2016   a. 03/2016 Afib dx-->echo: EF 50-55%, no rwma;  b. CHA2DSVASc = 1-->ASA only.  . Panic attacks   . Snores    a. Had sleep study but had trouble sleeping.  Doesn't think he'd tolerate CPAP/nasal pillow on face anyway.    Surgical History: Past Surgical History:  Procedure Laterality Date  . COLONOSCOPY WITH PROPOFOL N/A 09/12/2018   Procedure: COLONOSCOPY WITH PROPOFOL;  Surgeon: Toledo, Benay Pike, MD;  Location: [facility] ENDOSCOPY;  Service: Gastroenterology;  Laterality: N/A;  . ESOPHAGOGASTRODUODENOSCOPY    . ESOPHAGOGASTRODUODENOSCOPY (EGD) WITH PROPOFOL N/A 09/12/2018   Procedure: ESOPHAGOGASTRODUODENOSCOPY (EGD) WITH PROPOFOL;  Surgeon: Toledo, Benay Pike,  MD;  Location: [facility] ENDOSCOPY;  Service: Gastroenterology;  Laterality: N/A;  . KNEE ARTHROSCOPY  1998   right knee  . NASAL SINUS SURGERY  1996   Family History: Family History  Problem Relation Age of Onset  . Cancer Father 2       bladder    Social History:  reports that he quit smoking about 35 years ago. His smoking use included cigarettes. His smokeless tobacco use includes snuff. He reports that he does not drink alcohol and does not use drugs.  Physical Exam: Pulse 84   Ht 6\' 4"  (1.93 m)   Wt (!) 321 lb (145.6 kg)   BMI 39.07 kg/m  Afebrile  Constitutional:  Alert and oriented, No acute distress. Cardiovascular: Regular rate and rhythm Respiratory: Normal respiratory effort, no increased work of breathing. GI: Abdomen is soft, nontender, nondistended, no abdominal masses GU: Left CVA tenderness  Laboratory Data: Reviewed, see HPI  Pertinent Imaging: I have personally viewed and interpreted the stat CT showing a 7 mm left proximal ureteral stone with upstream hydronephrosis  Assessment & Plan:   55 year old male with 1 week of left-sided flank pain and stat CT today showing a 7 mm left proximal ureteral stone with upstream hydronephrosis.  Urinalysis is also concerning for infection today, though he has no symptoms of UTI, or systemic signs of infection, and vitals are stable.  We discussed that typically we would place a stent urgently in the setting of a possibly infected obstructing system, but  that he has an atypical presentation and that he currently is not having pain or any signs of infection.  We reviewed options at length including urgent stent placement today, medical expulsive therapy, or ureteroscopy, laser lithotripsy, and stent placement.  He is not a candidate for shockwave lithotripsy with his morbid obesity, and suspected possible infection.  I strongly encouraged him to consider ureteroscopy, laser lithotripsy, and stent placement this week, with starting  antibiotics today.  We specifically discussed the risks ureteroscopy including bleeding, infection/sepsis, stent related symptoms including flank pain/urgency/frequency/incontinence/dysuria, ureteral injury, inability to access stone, or need for staged or additional procedures.  -Cipro 500 mg twice daily x10 days -Schedule left ureteroscopy, laser lithotripsy, stent placement this Friday -Return precautions discussed extensively.  Also discussed with urologist on-call at [facility] today Dr. Nolberto Hanlon, MD 11/03/2020  [facility] [address], [place], [place] [address] [phone]

## 2020-11-03 NOTE — Progress Notes (Signed)
11/03/20 9:35 AM   Nathaniel Mcgee [birth year] [id number]  CC: Left flank pain   HPI: I saw Nathaniel Mcgee in clinic today for evaluation of left flank pain and possible kidney stone.  He reports 1 week of intermittent left-sided flank pain that radiates to the left groin.  He was seen in urgent care at [facility] and was found to have microscopic hematuria, but no imaging was performed, and they recommended urology follow-up for a possible kidney stone.  His pain has been intermittent since that time, but is well controlled with NSAIDs.  He denies any prior stone episodes.  He denies any fevers, chills, dysuria, urgency/frequency.  He denies any history of UTIs.  Urinalysis today with many bacteria, 6-10 RBCs, 0-5 squamous cells, greater than 50 WBCs, small leukocytes, nitrite negative.   PMH: Past Medical History:  Diagnosis Date  . Atypical chest pain    a. In setting of Afib in the past;  b. 03/2016 ETT: poor ex tolerance w/ hypertensive response and 21mm upslopting inflat ST dep;  c. 05/2016 Cardiac CTA: Ca2+ score = 0. Nl right dominant cors.  . Claustrophobia   . Dysrhythmia    A-fib  . GERD (gastroesophageal reflux disease)   . Hypertension   . Morbid obesity ([place])   . PAF (paroxysmal atrial fibrillation) ([place]) 04/01/2016   a. 03/2016 Afib dx-->echo: EF 50-55%, no rwma;  b. CHA2DSVASc = 1-->ASA only.  . Panic attacks   . Snores    a. Had sleep study but had trouble sleeping.  Doesn't think he'd tolerate CPAP/nasal pillow on face anyway.    Surgical History: Past Surgical History:  Procedure Laterality Date  . COLONOSCOPY WITH PROPOFOL N/A 09/12/2018   Procedure: COLONOSCOPY WITH PROPOFOL;  Surgeon: Toledo, Benay Pike, MD;  Location: [facility];  Service: Gastroenterology;  Laterality: N/A;  . ESOPHAGOGASTRODUODENOSCOPY    . ESOPHAGOGASTRODUODENOSCOPY (EGD) WITH PROPOFOL N/A 09/12/2018   Procedure: ESOPHAGOGASTRODUODENOSCOPY (EGD) WITH PROPOFOL;  Surgeon: Toledo, Benay Pike,  MD;  Location: [facility];  Service: Gastroenterology;  Laterality: N/A;  . KNEE ARTHROSCOPY  1998   right knee  . NASAL SINUS SURGERY  1996   Family History: Family History  Problem Relation Age of Onset  . Cancer Father 56       bladder    Social History:  reports that he quit smoking about 35 years ago. His smoking use included cigarettes. His smokeless tobacco use includes snuff. He reports that he does not drink alcohol and does not use drugs.  Physical Exam: Pulse 84   Ht 6\' 4"  (1.93 m)   Wt (!) 321 lb (145.6 kg)   BMI 39.07 kg/m  Afebrile  Constitutional:  Alert and oriented, No acute distress. Cardiovascular: Regular rate and rhythm Respiratory: Normal respiratory effort, no increased work of breathing. GI: Abdomen is soft, nontender, nondistended, no abdominal masses GU: Left CVA tenderness  Laboratory Data: Reviewed, see HPI  Pertinent Imaging: I have personally viewed and interpreted the stat CT showing a 7 mm left proximal ureteral stone with upstream hydronephrosis  Assessment & Plan:   55 year old male with 1 week of left-sided flank pain and stat CT today showing a 7 mm left proximal ureteral stone with upstream hydronephrosis.  Urinalysis is also concerning for infection today, though he has no symptoms of UTI, or systemic signs of infection, and vitals are stable.  We discussed that typically we would place a stent urgently in the setting of a possibly infected obstructing system, but  that he has an atypical presentation and that he currently is not having pain or any signs of infection.  We reviewed options at length including urgent stent placement today, medical expulsive therapy, or ureteroscopy, laser lithotripsy, and stent placement.  He is not a candidate for shockwave lithotripsy with his morbid obesity, and suspected possible infection.  I strongly encouraged him to consider ureteroscopy, laser lithotripsy, and stent placement this week, with starting  antibiotics today.  We specifically discussed the risks ureteroscopy including bleeding, infection/sepsis, stent related symptoms including flank pain/urgency/frequency/incontinence/dysuria, ureteral injury, inability to access stone, or need for staged or additional procedures.  -Cipro 500 mg twice daily x10 days -Schedule left ureteroscopy, laser lithotripsy, stent placement this Friday -Return precautions discussed extensively.  Also discussed with urologist on-call at [facility] today Dr. Nolberto Hanlon, MD 11/03/2020  [facility] [address], [place], [place] [address] [phone]

## 2020-11-05 ENCOUNTER — Encounter
Admission: RE | Admit: 2020-11-05 | Discharge: 2020-11-05 | Disposition: A | Discharge status: Home / Self Care | Payer: Managed Care, Other (non HMO) | Source: Ambulatory Visit | Attending: Urology | Admitting: Urology

## 2020-11-05 ENCOUNTER — Other Ambulatory Visit: Payer: Self-pay

## 2020-11-05 HISTORY — DX: Personal history of urinary calculi: Z87.442

## 2020-11-05 HISTORY — DX: Anemia, unspecified: D64.9

## 2020-11-05 HISTORY — DX: Other complications of anesthesia, initial encounter: T88.59XA

## 2020-11-05 LAB — URINE CULTURE: Culture: NO GROWTH

## 2020-11-05 NOTE — Pre-Procedure Instructions (Signed)
Nathaniel Bush, MD  Physician  Cardiology  Progress Notes     Signed  Encounter Date:  10/05/2018           Signed      Expand All Collapse All      Virtual Visit via Video Note   This visit type was conducted due to national recommendations for restrictions regarding the COVID-19 Pandemic (e.g. social distancing) in an effort to limit this patient's exposure and mitigate transmission in our community.  Due to his co-morbid illnesses, this patient is at least at moderate risk for complications without adequate follow up.  This format is felt to be most appropriate for this patient at this time.  All issues noted in this document were discussed and addressed.  A limited physical exam was performed with this format.  Please refer to the patient's chart for his consent to telehealth for [facility].   Evaluation Performed:  Follow-up visit  Date:  10/05/2018   ID:  Nathaniel Mcgee, DOB 1966-01-18, MRN [id number]  Patient Location: Home  Provider Location: Office  PCP:  [name], NP           Cardiologist:  Nathaniel Bush, MD  Electrophysiologist:  None   Chief Complaint: Follow-up paroxysmal atrial fibrillation  History of Present Illness:    Nathaniel Mcgee is a 55 y.o. male who presents via audio/video conferencing for a telehealth visit today.  He has a history of paroxysmal atrial fibrillation, hypertension, atypical chest pain with normal coronaries by cardiac CTA, claustrophobia, GERD, and morbid obesity.  He was last seen in our office in 03/2017 by [name], NP, at which time he was doing well other than worsening erectile dysfunction and mild fatigue.  Transitioning from metoprolol to bisoprolol or diltiazem was discussed, but [name] wished to defer medication changes.  Mr. Weekes also declined referral for sleep study.  Today, [name] reports that he is feeling relatively well.  He almost ran out of metoprolol a few months ago and has only been  using his remaining supply intermittently for palpitations.  He notes improvement in his energy since coming off standing metoprolol.  He occasionally gets a feeling of an "adrenaline rush" that lasts about 30 minutes.  He will take the metoprolol and notice gradual improvement in the symptoms.  He usually checks his heart rate and rhythm using his phone and notes that it is around 80 to 90 bpm, regardless of if he feels well or is having one of his "adrenaline rushes."  He has not had shortness of breath, lightheadedness, or edema.  He notes occasional mild chest tightness when he gets anxious, which has been longstanding and is unchanged.  [name] underwent screening colonoscopy earlier this year and was told by the anesthesiologist that he likely has severe sleep apnea.  [name] notes that he previously underwent polysomnography, though the test had to be aborted due to his claustrophobia.  He does not think that he would tolerate repeat sleep study or CPAP use.  Mr. Clear notes that his erectile dysfunction improved after discontinuation of standing metoprolol.  He used sildenafil once and felt like it provided slight benefit.  The patient does not have symptoms concerning for COVID-19 infection (fever, chills, cough, or new shortness of breath).        Past Medical History:  Diagnosis Date  . Atypical chest pain    a. In setting of Afib in the past;  b. 03/2016 ETT: poor  ex tolerance w/ hypertensive response and 1mm upslopting inflat ST dep;  c. 05/2016 Cardiac CTA: Ca2+ score = 0. Nl right dominant cors.  . Claustrophobia   . Dysrhythmia    A-fib  . GERD (gastroesophageal reflux disease)   . Hypertension   . Morbid obesity ([facility])   . PAF (paroxysmal atrial fibrillation) ([facility]) 04/01/2016   a. 03/2016 Afib dx-->echo: EF 50-55%, no rwma;  b. CHA2DSVASc = 1-->ASA only.  . Panic attacks   . Snores    a. Had sleep study but had trouble sleeping.  Doesn't think he'd  tolerate CPAP/nasal pillow on face anyway.        Past Surgical History:  Procedure Laterality Date  . COLONOSCOPY WITH PROPOFOL N/A 09/12/2018   Procedure: COLONOSCOPY WITH PROPOFOL;  Surgeon: Toledo, Boykin Nearingeodoro K, MD;  Location: [facility] END[facility];  Service: Gastroenterology;  Laterality: N/A;  . ESOPHAGOGASTRODUODENOSCOPY    . ESOPHAGOGASTRODUODENOSCOPY (EGD) WITH PROPOFOL N/A 09/12/2018   Procedure: ESOPHAGOGASTRODUODENOSCOPY (EGD) WITH PROPOFOL;  Surgeon: Toledo, Boykin Nearingeodoro K, MD;  Location: [facility];  Service: Gastroenterology;  Laterality: N/A;  . KNEE ARTHROSCOPY  1998   right knee  . NASAL SINUS SURGERY  1996     Active Medications      Current Meds  Medication Sig  . aspirin EC 81 MG tablet Take 1 tablet (81 mg total) by mouth daily.  . chlorzoxazone (PARAFON) 500 MG tablet Take 1 tablet by mouth 3 (three) times daily as needed.   . Multiple Vitamin (MULTIVITAMIN) tablet Take 1 tablet by mouth daily.  . pantoprazole (PROTONIX) 40 MG tablet Take 40 mg by mouth daily.  . sildenafil (VIAGRA) 50 MG tablet Take 1 tablet (50 mg total) by mouth daily as needed for erectile dysfunction.  . sucralfate (CARAFATE) 1 g tablet Take 1 g by mouth daily as needed (stomach problems).  . [DISCONTINUED] aspirin EC 81 MG tablet Take 1 tablet (81 mg total) by mouth daily. (Patient taking differently: Take 81 mg by mouth daily as needed. )       Allergies:   Sulfa antibiotics   Social History        Tobacco Use  . Smoking status: Former Smoker    Types: Cigarettes    Last attempt to quit: 1987    Years since quitting: 33.2  . Smokeless tobacco: Current User    Types: Snuff  Substance Use Topics  . Alcohol use: No  . Drug use: No     Family Hx: The patient's family history includes Cancer (age of onset: 4072) in his father.  ROS:   Please see the history of present illness.   All other systems reviewed and are negative.   Prior CV studies:   The following  studies were reviewed today:  Coronary CTA (06/21/2016): Normal coronary arteries without stenosis.  Coronary calcium score is 0.  Incidental note made of hepatic steatosis.  Exercise tolerance test 04/12/2016): Intermediate probability stress test with 1 mm upsloping ST depression in the inferolateral leads and low exercise capacity.  TTE (04/02/2016): Technically difficult study.  Normal LV size.  LVEF 50-55% with normal wall motion.  Labs/Other Tests and Data Reviewed:    EKG:  No ECG reviewed.  Recent Labs: No results found for requested labs within last 8760 hours.   Recent Lipid Panel Labs (Brief)       Lab Results  Component Value Date/Time   CHOL 173 06/13/2016 09:02 AM   TRIG 88 06/13/2016 09:02 AM   HDL 50 06/13/2016  09:02 AM   CHOLHDL 3.5 06/13/2016 09:02 AM   LDLCALC 105 (H) 06/13/2016 09:02 AM         Wt Readings from Last 3 Encounters:  10/05/18 (!) 347 lb (157.4 kg)  09/12/18 (!) 345 lb (156.5 kg)  03/28/17 (!) 361 lb 8 oz (164 kg)     Objective:    Vital Signs:  BP 133/84 (BP Location: Left Arm, Patient Position: Sitting, Cuff Size: Large)   Pulse 72   Ht 6\' 4"  (1.93 m)   Wt (!) 347 lb (157.4 kg)   BMI 42.24 kg/m    Obese man, seated comfortably in his home.  ASSESSMENT & PLAN:    Paroxysmal atrial fibrillation: Mr. Brallier notes occasional "adrenaline rush" sensations, which he feels are different than what he experienced in the past with atrial fibrillation.  It is hard to know if these could represent transient arrhythmias.  Sensation seems to respond to metoprolol, though overall Mr. Keelin feels better since discontinuation of standing metoprolol.  We will therefore try diltiazem 180 mg daily.  Given CHADSVASC score of 1, we will continue with low-dose aspirin for stroke prophylaxis.  Erectile dysfunction: Somewhat improved with discontinuation of metoprolol.  Hopefully, this will continue.  Mr. Schmid can use sildenafil as  needed should he have worsening ED in the future.  Noncardiac chest pain: Intermittent chest pain with anxiety unchanged.  Cardiac CTA in 2017 showed no CAD.  No further work-up or intervention at this time.  Morbid obesity: Mr. Berlinger is trying to lose weight, though he remains morbidly obese.  Importance of lifestyle modifications, including dietary changes and increased activity were stressed.  Given that he likely has severe sleep apnea but is unable to be treated due to his profound claustrophobia, weight loss may be the most beneficial treatment for him.  COVID-19 Education: The signs and symptoms of COVID-19 were discussed with the patient and how to seek care for testing (follow up with PCP or arrange E-visit).  The importance of social distancing was discussed today.  Time:   Today, I have spent 15 minutes with the patient with telehealth technology discussing the above problems.     Medication Adjustments/Labs and Tests Ordered: Current medicines are reviewed at length with the patient today.  Concerns regarding medicines are outlined above.   Tests Ordered: None.  Medication Changes:     Meds ordered this encounter  Medications  . aspirin EC 81 MG tablet    Sig: Take 1 tablet (81 mg total) by mouth daily.  Marland Kitchen diltiazem (CARDIZEM CD) 180 MG 24 hr capsule    Sig: Take 1 capsule (180 mg total) by mouth daily.    Dispense:  90 capsule    Refill:  3    Disposition:  Follow up in 4 month(s)  Signed, Nathaniel Bush, MD  10/05/2018 10:58 AM    [facility]         Electronically signed by Nathaniel Bush, MD at 10/05/2018 11:01 AM  Telemedicine on 10/05/2018      Telemedicine on 10/05/2018         Detailed Report       Note shared with patient    Additional Documentation  Vitals:  BP 133/84 (BP Location: Left Arm, Patient Position: Sitting, Cuff Size: Large)   Pulse 72   Ht 6\' 4"  (1.93 m)   Wt 157.4  kgImportant    BMI 42.24 kg/m   BSA 2.91 m   Flowsheets:  MEWS  Score,   Anthropometrics     Encounter Info:  Billing Info,   History,   Allergies,   Detailed Report       Orders Placed   None   Medication Changes     dilTIAZem HCl Coated Beads 180 mg Oral Daily       (Discontinued by provider)  Patient not taking:  Reported on 10/05/2018    Medication List   Visit Diagnoses     Paroxysmal atrial fibrillation ([facility])     Erectile dysfunction, unspecified erectile dysfunction type     Chest pain, unspecified type     Morbid obesity ([place])     Educated About Covid-19 Virus Infection    Problem List

## 2020-11-05 NOTE — Patient Instructions (Addendum)
Your procedure is scheduled on:11-06-20 FRIDAY Report to the Registration Desk on the 1st floor of the [facility]-Then proceed to the 2nd floor Surgery Desk in the [facility] To find out your arrival time, please call [phone] between 1PM - 3PM on:11-05-20 THURSDAY  REMEMBER: Instructions that are not followed completely may result in serious medical risk, up to and including death; or upon the discretion of your surgeon and anesthesiologist your surgery may need to be rescheduled.  Do not eat food OR liquids after midnight the night before surgery.  No gum chewing, lozengers or hard candies   TAKE THESE MEDICATIONS THE MORNING OF SURGERY WITH A SIP OF WATER: -PROTONIX (PANTOPRAZOLE)-take one the night before and one on the morning of surgery - helps to prevent nausea after surgery.)  One week prior to surgery: Stop Anti-inflammatories (NSAIDS) such as Advil, Aleve, Ibuprofen, Motrin, Naproxen, Naprosyn and Aspirin based products such as Excedrin, Goodys Powder, BC Powder-OK TO TAKE TYLENOL IF NEEDED  No Alcohol for 24 hours before or after surgery.  No Smoking including e-cigarettes for 24 hours prior to surgery.  No chewable tobacco products for at least 6 hours prior to surgery.  No nicotine patches on the day of surgery.  Do not use any "recreational" drugs for at least a week prior to your surgery.  Please be advised that the combination of cocaine and anesthesia may have negative outcomes, up to and including death. If you test positive for cocaine, your surgery will be cancelled.  On the morning of surgery brush your teeth with toothpaste and water, you may rinse your mouth with mouthwash if you wish. Do not swallow any toothpaste or mouthwash.  Do not wear jewelry, make-up, hairpins, clips or nail polish.  Do not wear lotions, powders, or perfumes.   Do not shave body from the neck down 48 hours prior to surgery just in case you cut yourself which could leave a site  for infection.  Also, freshly shaved skin may become irritated if using the CHG soap.  Contact lenses, hearing aids and dentures may not be worn into surgery.  Do not bring valuables to the hospital. [facility] is not responsible for any missing/lost belongings or valuables.   Notify your doctor if there is any change in your medical condition (cold, fever, infection).  Wear comfortable clothing (specific to your surgery type) to the hospital.  Plan for stool softeners for home use; pain medications have a tendency to cause constipation. You can also help prevent constipation by eating foods high in fiber such as fruits and vegetables and drinking plenty of fluids as your diet allows.  After surgery, you can help prevent lung complications by doing breathing exercises.  Take deep breaths and cough every 1-2 hours. Your doctor may order a device called an Incentive Spirometer to help you take deep breaths. When coughing or sneezing, hold a pillow firmly against your incision with both hands. This is called "splinting." Doing this helps protect your incision. It also decreases belly discomfort.  If you are being admitted to the hospital overnight, leave your suitcase in the car. After surgery it may be brought to your room.  If you are being discharged the day of surgery, you will not be allowed to drive home. You will need a responsible adult (18 years or older) to drive you home and stay with you that night.   If you are taking public transportation, you will need to have a responsible adult (18  years or older) with you. Please confirm with your physician that it is acceptable to use public transportation.   Please call the [place] Dept. at [phone] if you have any questions about these instructions.  Surgery Visitation Policy:  Patients undergoing a surgery or procedure may have one family member or support person with them as long as that person is not COVID-19  positive or experiencing its symptoms.  That person may remain in the waiting area during the procedure.  Inpatient Visitation:    Visiting hours are 7 a.m. to 8 p.m. Inpatients will be allowed two visitors daily. The visitors may change each day during the patient's stay. No visitors under the age of 49. Any visitor under the age of 37 must be accompanied by an adult. The visitor must pass COVID-19 screenings, use hand sanitizer when entering and exiting the patient's room and wear a mask at all times, including in the patient's room. Patients must also wear a mask when staff or their visitor are in the room. Masking is required regardless of vaccination status.

## 2020-11-06 ENCOUNTER — Ambulatory Visit: Payer: Managed Care, Other (non HMO)

## 2020-11-06 ENCOUNTER — Ambulatory Visit: Payer: Managed Care, Other (non HMO) | Admitting: Anesthesiology

## 2020-11-06 ENCOUNTER — Encounter: Payer: Self-pay | Admitting: Urology

## 2020-11-06 ENCOUNTER — Ambulatory Visit
Admission: RE | Admit: 2020-11-06 | Discharge: 2020-11-06 | Disposition: A | Discharge status: Home / Self Care | Payer: Managed Care, Other (non HMO) | Attending: Urology | Admitting: Urology

## 2020-11-06 ENCOUNTER — Encounter
Admission: RE | Disposition: A | Discharge status: Home / Self Care | Payer: Self-pay | Source: Home / Self Care | Attending: Urology

## 2020-11-06 DIAGNOSIS — Z87891 Personal history of nicotine dependence: Secondary | ICD-10-CM | POA: Diagnosis not present

## 2020-11-06 DIAGNOSIS — N201 Calculus of ureter: Secondary | ICD-10-CM

## 2020-11-06 DIAGNOSIS — I480 Paroxysmal atrial fibrillation: Secondary | ICD-10-CM | POA: Insufficient documentation

## 2020-11-06 DIAGNOSIS — N132 Hydronephrosis with renal and ureteral calculous obstruction: Secondary | ICD-10-CM | POA: Diagnosis present

## 2020-11-06 DIAGNOSIS — I4891 Unspecified atrial fibrillation: Secondary | ICD-10-CM | POA: Diagnosis not present

## 2020-11-06 DIAGNOSIS — I10 Essential (primary) hypertension: Secondary | ICD-10-CM | POA: Insufficient documentation

## 2020-11-06 DIAGNOSIS — Z6839 Body mass index (BMI) 39.0-39.9, adult: Secondary | ICD-10-CM | POA: Insufficient documentation

## 2020-11-06 HISTORY — PX: CYSTOSCOPY/URETEROSCOPY/HOLMIUM LASER/STENT PLACEMENT: SHX6546

## 2020-11-06 LAB — BASIC METABOLIC PANEL
Anion gap: 8 (ref 5–15)
BUN: 9 mg/dL (ref 6–20)
CO2: 26 mmol/L (ref 22–32)
Calcium: 9.4 mg/dL (ref 8.9–10.3)
Chloride: 105 mmol/L (ref 98–111)
Creatinine, Ser: 1.06 mg/dL (ref 0.61–1.24)
GFR, Estimated: >60 mL/min (ref >60)
Glucose, Bld: 90 mg/dL (ref 70–99)
Potassium: 3.8 mmol/L (ref 3.5–5.1)
Sodium: 139 mmol/L (ref 135–145)

## 2020-11-06 SURGERY — CYSTOSCOPY/URETEROSCOPY/HOLMIUM LASER/STENT PLACEMENT
Anesthesia: General | Laterality: Left

## 2020-11-06 MED ORDER — ORAL CARE MOUTH RINSE: 15.0000 mL | Freq: Once | OROMUCOSAL | Status: DC

## 2020-11-06 MED ORDER — GLYCOPYRROLATE 0.2 MG/ML IJ SOLN
INTRAMUSCULAR | Status: DC | PRN
  Administered 2020-11-06: .2 mg via INTRAVENOUS

## 2020-11-06 MED ORDER — HYDROCODONE-ACETAMINOPHEN 5-325 MG PO TABS: 1.0000 | ORAL_TABLET | Freq: Four times a day (QID) | ORAL | 0 refills | Status: AC | PRN

## 2020-11-06 MED ORDER — ACETAMINOPHEN 10 MG/ML IV SOLN
INTRAVENOUS | Status: AC
  Filled 2020-11-06: qty 200

## 2020-11-06 MED ORDER — MIDAZOLAM HCL 2 MG/2ML IJ SOLN
INTRAMUSCULAR | Status: AC
  Filled 2020-11-06: qty 4

## 2020-11-06 MED ORDER — DEXAMETHASONE SODIUM PHOSPHATE 10 MG/ML IJ SOLN
INTRAMUSCULAR | Status: DC | PRN
  Administered 2020-11-06: 10 mg via INTRAVENOUS

## 2020-11-06 MED ORDER — FENTANYL CITRATE (PF) 100 MCG/2ML IJ SOLN
INTRAMUSCULAR | Status: AC
  Filled 2020-11-06: qty 2

## 2020-11-06 MED ORDER — MIDAZOLAM HCL 2 MG/2ML IJ SOLN
INTRAMUSCULAR | Status: DC | PRN
  Administered 2020-11-06: 4 mg via INTRAVENOUS

## 2020-11-06 MED ORDER — SUGAMMADEX SODIUM 500 MG/5ML IV SOLN
INTRAVENOUS | Status: DC | PRN
  Administered 2020-11-06: 500 mg via INTRAVENOUS

## 2020-11-06 MED ORDER — PHENYLEPHRINE HCL (PRESSORS) 10 MG/ML IV SOLN
INTRAVENOUS | Status: DC | PRN
  Administered 2020-11-06: 200 ug via INTRAVENOUS

## 2020-11-06 MED ORDER — DEXMEDETOMIDINE (PRECEDEX) IN NS 20 MCG/5ML (4 MCG/ML) IV SYRINGE
PREFILLED_SYRINGE | INTRAVENOUS | Status: DC | PRN
  Administered 2020-11-06 (×2): 20 ug via INTRAVENOUS

## 2020-11-06 MED ORDER — TAMSULOSIN HCL 0.4 MG PO CAPS: 0.4000 mg | ORAL_CAPSULE | Freq: Every day | ORAL | 0 refills | Status: DC

## 2020-11-06 MED ORDER — LIDOCAINE HCL (CARDIAC) PF 100 MG/5ML IV SOSY
PREFILLED_SYRINGE | INTRAVENOUS | Status: DC | PRN
  Administered 2020-11-06: 100 mg via INTRAVENOUS

## 2020-11-06 MED ORDER — ONDANSETRON HCL 4 MG/2ML IJ SOLN
INTRAMUSCULAR | Status: DC | PRN
  Administered 2020-11-06: 4 mg via INTRAVENOUS

## 2020-11-06 MED ORDER — CHLORHEXIDINE GLUCONATE 0.12 % MT SOLN
OROMUCOSAL | Status: AC
  Filled 2020-11-06: qty 15

## 2020-11-06 MED ORDER — BELLADONNA ALKALOIDS-OPIUM 16.2-60 MG RE SUPP
RECTAL | Status: DC | PRN
  Administered 2020-11-06: 1 via RECTAL

## 2020-11-06 MED ORDER — CEFAZOLIN SODIUM-DEXTROSE 2-4 GM/100ML-% IV SOLN
2.0000 g | INTRAVENOUS | Status: AC
  Administered 2020-11-06: 2 g via INTRAVENOUS
  Administered 2020-11-06: 1 g via INTRAVENOUS

## 2020-11-06 MED ORDER — PROPOFOL 10 MG/ML IV BOLUS
INTRAVENOUS | Status: DC | PRN
  Administered 2020-11-06: 200 mg via INTRAVENOUS

## 2020-11-06 MED ORDER — BELLADONNA ALKALOIDS-OPIUM 16.2-60 MG RE SUPP
RECTAL | Status: AC
  Filled 2020-11-06: qty 1

## 2020-11-06 MED ORDER — CHLORHEXIDINE GLUCONATE 0.12 % MT SOLN: 15.0000 mL | Freq: Once | OROMUCOSAL | Status: DC

## 2020-11-06 MED ORDER — FENTANYL CITRATE (PF) 100 MCG/2ML IJ SOLN
25.0000 ug | INTRAMUSCULAR | Status: DC | PRN
  Administered 2020-11-06: 25 ug via INTRAVENOUS

## 2020-11-06 MED ORDER — IOHEXOL 180 MG/ML  SOLN
INTRAMUSCULAR | Status: DC | PRN
  Administered 2020-11-06: 5 mL

## 2020-11-06 MED ORDER — MEPERIDINE HCL 25 MG/ML IJ SOLN: 6.2500 mg | INTRAMUSCULAR | Status: DC | PRN

## 2020-11-06 MED ORDER — DEXMEDETOMIDINE (PRECEDEX) IN NS 20 MCG/5ML (4 MCG/ML) IV SYRINGE
PREFILLED_SYRINGE | INTRAVENOUS | Status: AC
  Filled 2020-11-06: qty 10

## 2020-11-06 MED ORDER — ONDANSETRON HCL 4 MG/2ML IJ SOLN: 4.0000 mg | Freq: Once | INTRAMUSCULAR | Status: DC | PRN

## 2020-11-06 MED ORDER — ACETAMINOPHEN 10 MG/ML IV SOLN
INTRAVENOUS | Status: DC | PRN
  Administered 2020-11-06: 1000 mg via INTRAVENOUS

## 2020-11-06 MED ORDER — CEFAZOLIN SODIUM-DEXTROSE 2-4 GM/100ML-% IV SOLN
INTRAVENOUS | Status: AC
  Filled 2020-11-06: qty 100

## 2020-11-06 MED ORDER — LACTATED RINGERS IV SOLN: INTRAVENOUS | Status: DC

## 2020-11-06 MED ORDER — SUCCINYLCHOLINE CHLORIDE 20 MG/ML IJ SOLN
INTRAMUSCULAR | Status: DC | PRN
  Administered 2020-11-06: 120 mg via INTRAVENOUS
  Administered 2020-11-06: 40 mg via INTRAVENOUS

## 2020-11-06 MED ORDER — ROCURONIUM BROMIDE 100 MG/10ML IV SOLN
INTRAVENOUS | Status: DC | PRN
  Administered 2020-11-06: 10 mg via INTRAVENOUS

## 2020-11-06 MED ORDER — FENTANYL CITRATE (PF) 100 MCG/2ML IJ SOLN
INTRAMUSCULAR | Status: DC | PRN
  Administered 2020-11-06 (×2): 50 ug via INTRAVENOUS

## 2020-11-06 SURGICAL SUPPLY — 32 items
ADH LQ OCL WTPRF AMP STRL LF (MISCELLANEOUS) ×1
ADHESIVE MASTISOL STRL (MISCELLANEOUS) ×2 IMPLANT
BAG DRAIN CYSTO-URO LG1000N (MISCELLANEOUS) ×2 IMPLANT
BRUSH SCRUB EZ 1% IODOPHOR (MISCELLANEOUS) ×2 IMPLANT
CATH URET FLEX-TIP 2 LUMEN 10F (CATHETERS) ×2 IMPLANT
CATH URETL 5X70 OPEN END (CATHETERS) IMPLANT
CNTNR SPEC 2.5X3XGRAD LEK (MISCELLANEOUS)
CONT SPEC 4OZ STER OR WHT (MISCELLANEOUS)
CONT SPEC 4OZ STRL OR WHT (MISCELLANEOUS)
CONTAINER SPEC 2.5X3XGRAD LEK (MISCELLANEOUS) IMPLANT
DRAPE UTILITY 15X26 TOWEL STRL (DRAPES) ×2 IMPLANT
DRSG TEGADERM 2-3/8X2-3/4 SM (GAUZE/BANDAGES/DRESSINGS) ×2 IMPLANT
GLOVE SURG UNDER POLY LF SZ7.5 (GLOVE) ×2 IMPLANT
GOWN STRL REUS W/ TWL LRG LVL3 (GOWN DISPOSABLE) ×1 IMPLANT
GOWN STRL REUS W/ TWL XL LVL3 (GOWN DISPOSABLE) ×1 IMPLANT
GOWN STRL REUS W/TWL LRG LVL3 (GOWN DISPOSABLE) ×2
GOWN STRL REUS W/TWL XL LVL3 (GOWN DISPOSABLE) ×2
GUIDEWIRE STR DUAL SENSOR (WIRE) ×4 IMPLANT
INFUSOR MANOMETER BAG 3000ML (MISCELLANEOUS) ×2 IMPLANT
IV NS IRRIG 3000ML ARTHROMATIC (IV SOLUTION) ×2 IMPLANT
KIT TURNOVER CYSTO (KITS) ×2 IMPLANT
PACK CYSTO AR (MISCELLANEOUS) ×2 IMPLANT
SET CYSTO W/LG BORE CLAMP LF (SET/KITS/TRAYS/PACK) ×2 IMPLANT
SHEATH URETERAL 12FRX35CM (MISCELLANEOUS) IMPLANT
STENT URET 6FRX24 CONTOUR (STENTS) IMPLANT
STENT URET 6FRX26 CONTOUR (STENTS) IMPLANT
STENT URET 6FRX28 CONTOUR (STENTS) ×2 IMPLANT
SURGILUBE 2OZ TUBE FLIPTOP (MISCELLANEOUS) ×2 IMPLANT
SYR 10ML LL (SYRINGE) ×2 IMPLANT
TRACTIP FLEXIVA PULSE ID 200 (Laser) ×2 IMPLANT
VALVE UROSEAL ADJ ENDO (VALVE) IMPLANT
WATER STERILE IRR 1000ML POUR (IV SOLUTION) ×2 IMPLANT

## 2020-11-06 NOTE — Transfer of Care (Signed)
Immediate Anesthesia Transfer of Care Note  Patient: Nathaniel Mcgee  Procedure(s) Performed: CYSTOSCOPY/URETEROSCOPY/HOLMIUM LASER/STENT PLACEMENT (Left )  Patient Location: PACU  Anesthesia Type:General  Level of Consciousness: awake, drowsy and patient cooperative  Airway & Oxygen Therapy: Patient Spontanous Breathing and Patient connected to face mask oxygen  Post-op Assessment: Report given to RN and Post -op Vital signs reviewed and stable  Post vital signs: Reviewed and stable  Last Vitals:  Vitals Value Taken Time  BP 106/85 11/06/20 1431  Temp    Pulse 82 11/06/20 1439  Resp 12 11/06/20 1439  SpO2 96 % 11/06/20 1439  Vitals shown include unvalidated device data.  Last Pain:  Vitals:   11/06/20 1256  TempSrc: Temporal  PainSc: 7          Complications: No complications documented.

## 2020-11-06 NOTE — Discharge Instructions (Signed)
AMBULATORY SURGERY  DISCHARGE INSTRUCTIONS   1) The drugs that you were given will stay in your system until tomorrow so for the next 24 hours you should not:  A) Drive an automobile B) Make any legal decisions C) Drink any alcoholic beverage   2) You may resume regular meals tomorrow.  Today it is better to start with liquids and gradually work up to solid foods.  You may eat anything you prefer, but it is better to start with liquids, then soup and crackers, and gradually work up to solid foods.   3) Please notify your doctor immediately if you have any unusual bleeding, trouble breathing, redness and pain at the surgery site, drainage, fever, or pain not relieved by medication.    Please contact your physician with any problems or Same Day Surgery at [phone], Monday through Friday 6 am to 4 pm, or [place] at [facility] number at [phone]. Cystoscopy Cystoscopy is a procedure that is used to help diagnose and sometimes treat conditions that affect the lower urinary tract. The lower urinary tract includes the bladder and the urethra. The urethra is the tube that drains urine from the bladder. Cystoscopy is done using a thin, tube-shaped instrument with a light and camera at the end (cystoscope). The cystoscope may be hard or flexible, depending on the goal of the procedure. The cystoscope is inserted through the urethra, into the bladder. Cystoscopy may be recommended if you have:  Urinary tract infections that keep coming back.  Blood in the urine (hematuria).  An inability to control when you urinate (urinary incontinence) or an overactive bladder.  Unusual cells found in a urine sample.  A blockage in the urethra, such as a urinary stone.  Painful urination.  An abnormality in the bladder found during an intravenous pyelogram (IVP) or CT scan. Cystoscopy may also be done to remove a sample of tissue to be examined under a microscope (biopsy). Tell a health  care provider about:  Any allergies you have.  All medicines you are taking, including vitamins, herbs, eye drops, creams, and over-the-counter medicines.  Any problems you or family members have had with anesthetic medicines.  Any blood disorders you have.  Any surgeries you have had.  Any medical conditions you have.  Whether you are pregnant or may be pregnant. What are the risks? Generally, this is a safe procedure. However, problems may occur, including:  Infection.  Bleeding.  Allergic reactions to medicines.  Damage to other structures or organs. What happens before the procedure? Medicines Ask your health care provider about:  Changing or stopping your regular medicines. This is especially important if you are taking diabetes medicines or blood thinners.  Taking medicines such as aspirin and ibuprofen. These medicines can thin your blood. Do not take these medicines unless your health care provider tells you to take them.  Taking over-the-counter medicines, vitamins, herbs, and supplements. Tests You may have an exam or testing, such as:  X-rays of the bladder, urethra, or kidneys.  CT scan of the abdomen or pelvis.  Urine tests to check for signs of infection. General instructions  Follow instructions from your health care provider about eating or drinking restrictions.  Ask your health care provider what steps will be taken to help prevent infection. These steps may include: ? Washing skin with a germ-killing soap. ? Taking antibiotic medicine.  Plan to have a responsible adult take you home from the hospital or clinic. What happens during the procedure?  You will be given one or more of the following: ? A medicine to help you relax (sedative). ? A medicine to numb the area (local anesthetic).  The area around the opening of your urethra will be cleaned.  The cystoscope will be passed through your urethra into your bladder.  Germ-free (sterile)  fluid will flow through the cystoscope to fill your bladder. The fluid will stretch your bladder so that your health care provider can clearly examine your bladder walls.  Your doctor will look at the urethra and bladder. Your doctor may take a biopsy or remove stones.  The cystoscope will be removed, and your bladder will be emptied. The procedure may vary among health care providers and hospitals.   What can I expect after the procedure? After the procedure, it is common to have:  Some soreness or pain in your abdomen and urethra.  Urinary symptoms. These include: ? Mild pain or burning when you urinate. Pain should stop within a few minutes after you urinate. This may last for up to 1 week. ? A small amount of blood in your urine for several days. ? Feeling like you need to urinate but producing only a small amount of urine. Follow these instructions at home: Medicines  Take over-the-counter and prescription medicines only as told by your health care provider.  If you were prescribed an antibiotic medicine, take it as told by your health care provider. Do not stop taking the antibiotic even if you start to feel better. General instructions  Return to your normal activities as told by your health care provider. Ask your health care provider what activities are safe for you.  If you were given a sedative during the procedure, it can affect you for several hours. Do not drive or operate machinery until your health care provider says that it is safe.  Watch for any blood in your urine. If the amount of blood in your urine increases, call your health care provider.  Follow instructions from your health care provider about eating or drinking restrictions.  If a tissue sample was removed for testing (biopsy) during your procedure, it is up to you to get your test results. Ask your health care provider, or the department that is doing the test, when your results will be ready.  Drink  enough fluid to keep your urine pale yellow.  Keep all follow-up visits. This is important. Contact a health care provider if:  You have pain that gets worse or does not get better with medicine, especially pain when you urinate.  You have trouble urinating.  You have more blood in your urine. Get help right away if:  You have blood clots in your urine.  You have abdominal pain.  You have a fever or chills.  You are unable to urinate. Summary  Cystoscopy is a procedure that is used to help diagnose and sometimes treat conditions that affect the lower urinary tract.  Cystoscopy is done using a thin, tube-shaped instrument with a light and camera at the end.  After the procedure, it is common to have some soreness or pain in your abdomen and urethra.  Watch for any blood in your urine. If the amount of blood in your urine increases, call your health care provider.  If you were prescribed an antibiotic medicine, take it as told by your health care provider. Do not stop taking the antibiotic even if you start to feel better. This information is not intended to replace advice  given to you by your health care provider. Make sure you discuss any questions you have with your health care provider. Document Revised: 01/24/2020 Document Reviewed: 01/24/2020 Elsevier Patient Education  [place].

## 2020-11-06 NOTE — Op Note (Signed)
Date of procedure: 11/06/20  Preoperative diagnosis:  1. Left proximal ureteral stone  Postoperative diagnosis:  1. Same  Procedure: 1. Cystoscopy, left ureteroscopy, laser lithotripsy, left retrograde pyelogram with intraoperative interpretation, left ureteral stent placement with Dangler  Surgeon: Nickolas Madrid, MD  Anesthesia: General  Complications: None  Intraoperative findings:  1.  Moderate size prostate, no suspicious bladder lesions, ureteral orifices orthotopic bilaterally 2.  Uncomplicated dusting of left proximal ureteral stone and stent placement  EBL: Minimal  Specimens: None  Drains: Left 6 French by 28 cm ureteral stent with Dangler  Indication: Nathaniel Mcgee is a 55 y.o. patient with 5 mm left proximal ureteral stone and ongoing renal colic.  After reviewing the management options for treatment, they elected to proceed with the above surgical procedure(s). We have discussed the potential benefits and risks of the procedure, side effects of the proposed treatment, the likelihood of the patient achieving the goals of the procedure, and any potential problems that might occur during the procedure or recuperation. Informed consent has been obtained.  Description of procedure:  The patient was taken to the operating room and general anesthesia was induced. SCDs were placed for DVT prophylaxis. The patient was placed in the dorsal lithotomy position, prepped and draped in the usual sterile fashion, and preoperative antibiotics(Ancef) were administered. A preoperative time-out was performed.   A 21 French rigid cystoscope was used to intubate the urethra and a normal-appearing urethra was followed proximally to the bladder.  The prostate was large with obstructing lateral lobes.  Thorough cystoscopy was performed and there were no suspicious bladder lesions, and the ureteral orifices were orthotopic bilaterally.  A sensor wire was used to intubate the left ureteral  orifice and this advanced easily up to the kidney under fluoroscopic vision.  I could feel the wire past the proximal ureteral stone.  A dual-lumen ureteral access catheter was advanced over the wire and a second safety wire was added.  A single channel digital flexible ureteroscope advanced easily up to the level of the stone at the UPJ.  The stone was gently pushed back into the renal pelvis, and it was dusted with a 242 m laser fiber on settings of 0.3 J and 60 Hz.  Thorough pyeloscopy revealed no other stones or fragments.  Retrograde pyelogram was performed from the proximal ureter and showed no extravasation or filling defects.    Careful pullback ureteroscopy showed no ureteral injury or residual fragments.  The rigid cystoscope was backloaded over the wire and a 6 [place] by 28 cm stent was uneventfully placed under direct vision with an excellent curl in the upper pole, as well as in the bladder.  The bladder was drained, and the Dangler was attached to the penis using Tegaderm.  A belladonna suppository was placed.  Disposition: Stable to PACU  Plan: Remove stent at home on Tuesday morning  Nickolas Madrid, MD

## 2020-11-06 NOTE — Anesthesia Procedure Notes (Signed)
Procedure Name: Intubation Performed by: Fletcher-Harrison, Shawnmichael Parenteau, CRNA Pre-anesthesia Checklist: Patient identified, Emergency Drugs available, Suction available and Patient being monitored Patient Re-evaluated:Patient Re-evaluated prior to induction Oxygen Delivery Method: Circle system utilized Preoxygenation: Pre-oxygenation with 100% oxygen Induction Type: IV induction Ventilation: Mask ventilation without difficulty Laryngoscope Size: McGraph and 3 Grade View: Grade I Tube type: Oral Number of attempts: 1 Airway Equipment and Method: Stylet and Oral airway Placement Confirmation: ETT inserted through vocal cords under direct vision,  positive ETCO2,  breath sounds checked- equal and bilateral and CO2 detector Secured at: 21 cm Tube secured with: Tape Dental Injury: Teeth and Oropharynx as per pre-operative assessment        

## 2020-11-06 NOTE — Anesthesia Preprocedure Evaluation (Signed)
Anesthesia Evaluation  Patient identified by MRN, date of birth, ID band Patient awake    Reviewed: Allergy & Precautions, NPO status , Patient's Chart, lab work & pertinent test results  History of Anesthesia Complications (+) history of anesthetic complications  Airway Mallampati: III  TM Distance: >3 FB Neck ROM: Full    Dental  (+) Chipped   Pulmonary sleep apnea , former smoker,    Pulmonary exam normal        Cardiovascular hypertension, Pt. on medications and Pt. on home beta blockers Normal cardiovascular exam+ dysrhythmias Atrial Fibrillation      Neuro/Psych Anxiety    GI/Hepatic GERD  Medicated,  Endo/Other    Renal/GU      Musculoskeletal   Abdominal   Peds  Hematology   Anesthesia Other Findings Atypical chest pain    a. In setting of Afib in the past;  b. 03/2016 ETT: poor ex tolerance w/ hypertensive response and 76mm upslopting inflat ST dep;  c. 05/2016 Cardiac CTA: Ca2+ score = 0. Nl right dominant cors. . Claustrophobia  . Dysrhythmia   A-fib . GERD (gastroesophageal reflux disease)  . Hypertension  . Morbid obesity ([place])  . PAF (paroxysmal atrial fibrillation) ([place]) 04/01/2016  a. 03/2016 Afib dx-->echo: EF 50-55%, no rwma;  b. CHA2DSVASc = 1-->ASA only. . Panic attacks  . Snores   a. Had sleep study but had trouble sleeping.  Doesn't think he'd tolerate CPAP/nasal pillow on face anyway.    Reproductive/Obstetrics                             Anesthesia Physical  Anesthesia Plan  ASA: III  Anesthesia Plan: General   Post-op Pain Management:    Induction: Intravenous  PONV Risk Score and Plan: 2 and Ondansetron and Midazolam  Airway Management Planned: Oral ETT and Video Laryngoscope Planned  Additional Equipment:   Intra-op Plan:   Post-operative Plan:   Informed Consent: I have reviewed the patients History and Physical, chart, labs  and discussed the procedure including the risks, benefits and alternatives for the proposed anesthesia with the patient or authorized representative who has indicated his/her understanding and acceptance.       Plan Discussed with: CRNA, Anesthesiologist and Surgeon  Anesthesia Plan Comments:         Anesthesia Quick Evaluation

## 2020-11-06 NOTE — Interval H&P Note (Signed)
UROLOGY H&P UPDATE  Agree with prior H&P dated 11/03/2020.  Left 4 mm proximal ureteral stone with upstream hydronephrosis, small upper pole stone.  Cardiac: RRR Lungs: CTA bilaterally  Laterality: Left Procedure: Cystoscopy, left ureteroscopy, laser lithotripsy, stent placement  Urine: Culture 5/10 no growth  We specifically discussed the risks ureteroscopy including bleeding, infection/sepsis, stent related symptoms including flank pain/urgency/frequency/incontinence/dysuria, ureteral injury, inability to access stone, or need for staged or additional procedures.   [name], MD 11/06/2020

## 2020-11-07 NOTE — Anesthesia Postprocedure Evaluation (Signed)
Anesthesia Post Note  Patient: Nathaniel Mcgee  Procedure(s) Performed: CYSTOSCOPY/URETEROSCOPY/HOLMIUM LASER/STENT PLACEMENT (Left )  Patient location during evaluation: PACU Anesthesia Type: General Level of consciousness: awake and alert, awake and oriented Pain management: pain level controlled Vital Signs Assessment: post-procedure vital signs reviewed and stable Respiratory status: spontaneous breathing, nonlabored ventilation and respiratory function stable Cardiovascular status: blood pressure returned to baseline and stable Postop Assessment: no apparent nausea or vomiting Anesthetic complications: no   No complications documented.   Last Vitals:  Vitals:   11/06/20 1500 11/06/20 1518  BP:  119/74  Pulse: 71 (!) 57  Resp: 16 18  Temp: 36.7 C   SpO2: 97% 98%    Last Pain:  Vitals:   11/06/20 1500  TempSrc:   PainSc: 2                  Phill Mutter

## 2020-11-08 ENCOUNTER — Encounter: Payer: Self-pay | Admitting: Urology

## 2020-11-11 ENCOUNTER — Encounter: Payer: Self-pay | Admitting: Podiatry

## 2020-11-11 ENCOUNTER — Ambulatory Visit (INDEPENDENT_AMBULATORY_CARE_PROVIDER_SITE_OTHER): Payer: Managed Care, Other (non HMO)

## 2020-11-11 ENCOUNTER — Ambulatory Visit: Payer: Managed Care, Other (non HMO) | Admitting: Podiatry

## 2020-11-11 ENCOUNTER — Other Ambulatory Visit: Payer: Self-pay

## 2020-11-11 DIAGNOSIS — M7672 Peroneal tendinitis, left leg: Secondary | ICD-10-CM | POA: Diagnosis not present

## 2020-11-11 DIAGNOSIS — M7752 Other enthesopathy of left foot: Secondary | ICD-10-CM

## 2020-11-11 DIAGNOSIS — S93402A Sprain of unspecified ligament of left ankle, initial encounter: Secondary | ICD-10-CM | POA: Diagnosis not present

## 2020-11-11 DIAGNOSIS — M7750 Other enthesopathy of unspecified foot: Secondary | ICD-10-CM

## 2020-11-15 ENCOUNTER — Encounter: Payer: Self-pay | Admitting: Podiatry

## 2020-11-15 NOTE — Progress Notes (Signed)
  Subjective:  Patient ID: Nathaniel Mcgee, male    DOB: 05/20/1966,  MRN: [id number]  Chief Complaint  Patient presents with  . Foot Pain    Left foot and ankle pain    55 y.o. male presents with the above complaint. History confirmed with patient.  This is been going on for about 4 to 6 weeks mostly along the lateral ankle.  He says he fell in the driveway and may have rolled it.  Objective:  Physical Exam: warm, good capillary refill, no trophic changes or ulcerative lesions, normal DP and PT pulses and normal sensory exam. Left Foot: Pain over ATFL and peroneal tendons and with resisted eversion   Radiographs: X-ray of the left foot: no fracture, dislocation, swelling or degenerative changes noted Assessment:   1. Peroneal tendinitis of left lower leg   2. Sprain of left ankle, unspecified ligament, initial encounter      Plan:  Patient was evaluated and treated and all questions answered.  Reviewed RICE protocol for ankle sprain.  Recommended physical therapy.  I think he has some component of peroneal tendinitis.  Physical therapy referral was sent to resolve PT in [place].  Tri-Lock ankle brace dispensed.  Return in about 1 month (around 12/12/2020) for re-check peroneal tendinitis and Ankle sprain.

## 2020-12-16 ENCOUNTER — Ambulatory Visit: Payer: Managed Care, Other (non HMO) | Admitting: Podiatry

## 2020-12-16 ENCOUNTER — Other Ambulatory Visit: Payer: Self-pay

## 2020-12-16 ENCOUNTER — Encounter: Payer: Self-pay | Admitting: Podiatry

## 2020-12-16 DIAGNOSIS — S93402A Sprain of unspecified ligament of left ankle, initial encounter: Secondary | ICD-10-CM | POA: Diagnosis not present

## 2020-12-16 DIAGNOSIS — M7672 Peroneal tendinitis, left leg: Secondary | ICD-10-CM | POA: Diagnosis not present

## 2020-12-21 NOTE — Progress Notes (Signed)
  Subjective:  Patient ID: Nathaniel Mcgee, male    DOB: 09-17-1965,  MRN: [id number]  Chief Complaint  Patient presents with   Ankle Pain    "I still have some pain, but its better"    55 y.o. male returns for follow-up with the above complaint. History confirmed with patient.  Therapy and the brace have been helpful.  He thinks he is about 60% better  Objective:  Physical Exam: warm, good capillary refill, no trophic changes or ulcerative lesions, normal DP and PT pulses and normal sensory exam. Left Foot: Pain over ATFL and peroneal tendons and with resisted eversion   Radiographs: X-ray of the left foot: no fracture, dislocation, swelling or degenerative changes noted Assessment:   1. Peroneal tendinitis of left lower leg   2. Sprain of left ankle, unspecified ligament, initial encounter      Plan:  Patient was evaluated and treated and all questions answered.  Overall having a good amount of improvement.  Continue physical therapy for now.  He is done about 3 weeks.  I think he will likely need at least 6 to 8 weeks.  Continue wearing the Tri-Lock ankle brace.  Reevaluate in 2 months, hopefully will be nearly completely resolved by then  Return in about 2 months (around 02/15/2021) for re-check peroneal tendinitis.

## 2021-02-17 ENCOUNTER — Ambulatory Visit: Payer: Managed Care, Other (non HMO) | Admitting: Podiatry

## 2021-02-17 ENCOUNTER — Other Ambulatory Visit: Payer: Self-pay

## 2021-02-17 DIAGNOSIS — S93402S Sprain of unspecified ligament of left ankle, sequela: Secondary | ICD-10-CM

## 2021-02-17 DIAGNOSIS — M7672 Peroneal tendinitis, left leg: Secondary | ICD-10-CM

## 2021-02-22 NOTE — Progress Notes (Signed)
  Subjective:  Patient ID: Nathaniel Mcgee, male    DOB: [birth year],  MRN: [id number]  Chief Complaint  Patient presents with   Tendonitis     RECHECK PERONEAL TENDINITIS LEFT    55 y.o. male returns for follow-up with the above complaint. History confirmed with patient.  He has improved he thinks is about 70% better but does not seem to be changing much now.  Objective:  Physical Exam: warm, good capillary refill, no trophic changes or ulcerative lesions, normal DP and PT pulses and normal sensory exam. Left Foot: Pain over ATFL and peroneal tendons and with resisted eversion   Radiographs: X-ray of the left foot: no fracture, dislocation, swelling or degenerative changes noted Assessment:   1. Peroneal tendinitis of left lower leg   2. Sprain of left ankle, unspecified ligament, sequela       Plan:  Patient was evaluated and treated and all questions answered.  Overall he is doing well.  I discussed with him further treatment and imaging of the peroneal tendons to evaluate for tear including an ultrasound or MRI.  He has claustrophobia and thinks an MRI might not be possible for him.  I recommended a musculoskeletal ultrasound.  This was ordered and I discussed with him it may take a while to get the results of this but we will continue our current treatment plan until then.  Return in about 8 weeks (around 04/14/2021) for re-check peroneal tendinitis.

## 2021-03-05 ENCOUNTER — Ambulatory Visit
Admission: RE | Admit: 2021-03-05 | Discharge: 2021-03-05 | Disposition: A | Discharge status: Home / Self Care | Payer: Managed Care, Other (non HMO) | Source: Ambulatory Visit | Attending: Podiatry | Admitting: Podiatry

## 2021-03-05 DIAGNOSIS — S93402S Sprain of unspecified ligament of left ankle, sequela: Secondary | ICD-10-CM

## 2021-03-05 DIAGNOSIS — M7672 Peroneal tendinitis, left leg: Secondary | ICD-10-CM

## 2021-04-01 ENCOUNTER — Ambulatory Visit: Payer: Managed Care, Other (non HMO) | Admitting: Internal Medicine

## 2021-04-07 ENCOUNTER — Encounter: Payer: Self-pay | Admitting: Urology

## 2021-04-07 ENCOUNTER — Ambulatory Visit (INDEPENDENT_AMBULATORY_CARE_PROVIDER_SITE_OTHER): Payer: Managed Care, Other (non HMO) | Admitting: Urology

## 2021-04-07 ENCOUNTER — Other Ambulatory Visit: Payer: Self-pay

## 2021-04-07 VITALS — BP 138/85 | HR 71 | Ht 76.0 in | Wt 320.0 lb

## 2021-04-07 DIAGNOSIS — N3281 Overactive bladder: Secondary | ICD-10-CM

## 2021-04-07 DIAGNOSIS — N529 Male erectile dysfunction, unspecified: Secondary | ICD-10-CM | POA: Diagnosis not present

## 2021-04-07 DIAGNOSIS — N138 Other obstructive and reflux uropathy: Secondary | ICD-10-CM

## 2021-04-07 DIAGNOSIS — R300 Dysuria: Secondary | ICD-10-CM | POA: Diagnosis not present

## 2021-04-07 DIAGNOSIS — N401 Enlarged prostate with lower urinary tract symptoms: Secondary | ICD-10-CM

## 2021-04-07 LAB — BLADDER SCAN AMB NON-IMAGING

## 2021-04-07 MED ORDER — TAMSULOSIN HCL 0.4 MG PO CAPS: 0.4000 mg | ORAL_CAPSULE | Freq: Every day | ORAL | 2 refills | Status: DC

## 2021-04-07 MED ORDER — SILDENAFIL CITRATE 20 MG PO TABS: 20.0000 mg | ORAL_TABLET | ORAL | 6 refills | Status: DC | PRN

## 2021-04-07 NOTE — Progress Notes (Signed)
   04/07/2021 3:44 PM   Nathaniel Mcgee 03/13/1966 [id number]  Reason for visit: Urinary symptoms, ED  HPI: 55 year old male with morbid obesity who previously underwent left ureteroscopy and laser lithotripsy with me in May 2022 for a left proximal ureteral stone.  Prostate was moderate in size at that time with lateral lobe hypertrophy, but no median lobe, no bladder abnormalities.  Prostate measured 46 g on CT.  He reports some weakening urinary stream and urinary dribbling over the last few months, as well as nocturia 1-2 times overnight, and some urgency.  He thinks this is primarily new since after surgery, but was having some nocturia before surgery.  He denies any gross hematuria or dysuria.  Bladder scan today 60 mL, and he has not yet been able to void for urine sample.  He is also having some worsening trouble with erections.  He has previously tried Viagra in the past with good results and is interested in another prescription.  We discussed possible etiologies of his urinary symptoms including urethral stricture disease, BPH, or other new pathology.  I recommended following up his urinalysis once he is able to give one to evaluate for microscopic hematuria or UTI, and considering cystoscopy pending those findings.  Meanwhile I recommended a trial of Flomax over the next 1 to 2 weeks to see how he responds.  May be a good candidate for UroLift in the future if this is found to be secondary to BPH.  Follow-up urinalysis, call with results Trial of Flomax for urinary symptoms Consider cystoscopy pending UA results Trial of Viagra for ED   [name], MD  [facility] [address] [place], [place] [address] [phone]

## 2021-04-14 ENCOUNTER — Other Ambulatory Visit: Payer: Self-pay

## 2021-04-14 ENCOUNTER — Ambulatory Visit: Payer: Managed Care, Other (non HMO) | Admitting: Podiatry

## 2021-04-14 DIAGNOSIS — S86312D Strain of muscle(s) and tendon(s) of peroneal muscle group at lower leg level, left leg, subsequent encounter: Secondary | ICD-10-CM

## 2021-04-14 DIAGNOSIS — M25372 Other instability, left ankle: Secondary | ICD-10-CM | POA: Diagnosis not present

## 2021-04-14 LAB — URINALYSIS, COMPLETE
Bilirubin, UA: NEGATIVE
Glucose, UA: NEGATIVE
Leukocytes,UA: NEGATIVE
Nitrite, UA: NEGATIVE
Protein,UA: NEGATIVE
RBC, UA: NEGATIVE
Specific Gravity, UA: 1.02 (ref 1.005–1.030)
Urobilinogen, Ur: 0.2 mg/dL (ref 0.2–1.0)
pH, UA: 6 (ref 5.0–7.5)

## 2021-04-14 LAB — MICROSCOPIC EXAMINATION
Bacteria, UA: NONE SEEN
Epithelial Cells (non renal): NONE SEEN /hpf (ref 0–10)
RBC, Urine: NONE SEEN /hpf (ref 0–2)
WBC, UA: NONE SEEN /hpf (ref 0–5)

## 2021-04-15 ENCOUNTER — Telehealth: Payer: Self-pay

## 2021-04-15 NOTE — Progress Notes (Signed)
  Subjective:  Patient ID: Nathaniel Mcgee, male    DOB: [birth year],  MRN: [id number]  Chief Complaint  Patient presents with   Tendonitis    Return in about 8 weeks  for re-check peroneal tendinitis    55 y.o. male returns for follow-up with the above complaint. History confirmed with patient.  He completed the MRI.  Pain is somewhat better but is painful the more he is on it, still feels very uneasy on uneven ground such as being in the yard  Objective:  Physical Exam: warm, good capillary refill, no trophic changes or ulcerative lesions, normal DP and PT pulses and normal sensory exam. Left Foot: Mild pain over ATFL and peroneal tendons and with resisted eversion   Radiographs: X-ray of the left foot: no fracture, dislocation, swelling or degenerative changes noted  IMPRESSION: Tendinosis and tenosynovitis of the peroneal longus and brevis tendons, with evidence of inframalleolar peroneus brevis split tear. There is continuity distally seen at the base of the fifth metatarsal.   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 The calcaneofibular ligament appears intact.   Assessment:   1. Tear of peroneal tendon, left, subsequent encounter   2. Left ankle instability       Plan:  Patient was evaluated and treated and all questions answered.  Reviewed the findings of the ultrasound with him.  Overall he is done much better since he started physical therapy and has been treating this nonoperatively.  We discussed the results of the ultrasound and the possibility of surgical intervention to repair the tear and stabilize the lateral ankle.  We think with his anxiety/claustrophobia of being in an enclosed space with his foot such as a cast after surgery he would have a difficult time with this.  Surgery would only be our last resort.  I think he continue physical therapy and see when he can maximize nonoperatively.  He will resume therapy and I will see him back in 3 months  Return in about 3 months  (around 07/15/2021) for re-check peroneal tendinitis.

## 2021-04-15 NOTE — Telephone Encounter (Signed)
-----   Message from [name], MD sent at 04/15/2021  7:34 AM EDT ----- UA normal, recommend cysto if worsening urinary symptoms, rtc 3 months symptom check and PVR, thanks  Nickolas Madrid, MD 04/15/2021

## 2021-04-19 NOTE — Telephone Encounter (Signed)
Patient notified he states symptoms are not worse at this time. Appointment made for 3 mo w/PVR

## 2021-04-23 ENCOUNTER — Other Ambulatory Visit: Payer: Self-pay | Admitting: Unknown Physician Specialty

## 2021-04-23 DIAGNOSIS — R1310 Dysphagia, unspecified: Secondary | ICD-10-CM

## 2021-05-03 ENCOUNTER — Ambulatory Visit: Payer: Managed Care, Other (non HMO) | Admitting: Medical

## 2021-05-03 ENCOUNTER — Encounter: Payer: Self-pay | Admitting: Medical

## 2021-05-03 ENCOUNTER — Other Ambulatory Visit: Payer: Self-pay

## 2021-05-03 VITALS — BP 128/88 | HR 65 | Ht 76.0 in | Wt 328.0 lb

## 2021-05-03 DIAGNOSIS — G479 Sleep disorder, unspecified: Secondary | ICD-10-CM

## 2021-05-03 DIAGNOSIS — I10 Essential (primary) hypertension: Secondary | ICD-10-CM

## 2021-05-03 DIAGNOSIS — R0789 Other chest pain: Secondary | ICD-10-CM | POA: Diagnosis not present

## 2021-05-03 DIAGNOSIS — I480 Paroxysmal atrial fibrillation: Secondary | ICD-10-CM | POA: Diagnosis not present

## 2021-05-03 NOTE — Patient Instructions (Signed)
Medication Instructions:  No changes at this time.  *If you need a refill on your cardiac medications before your next appointment, please call your pharmacy*   Lab Work: None  If you have labs (blood work) drawn today and your tests are completely normal, you will receive your results only by: [place] (if you have MyChart) OR A paper copy in the mail If you have any lab test that is abnormal or we need to change your treatment, we will call you to review the results.   Testing/Procedures: None   Follow-Up: At [facility], you and your health needs are our priority.  As part of our continuing mission to provide you with exceptional heart care, we have created designated Provider Care Teams.  These Care Teams include your primary Cardiologist (physician) and Advanced Practice Providers (APPs -  Physician Assistants and Nurse Practitioners) who all work together to provide you with the care you need, when you need it.   Your next appointment:   1 month(s)  The format for your next appointment:   In Person  Provider:   You may see Nelva Bush, MD or one of the following Advanced Practice Providers on your designated Care Team:   Murray Hodgkins, NP Christell Faith, PA-C Cadence Kathlen Mody, [place]

## 2021-05-03 NOTE — Progress Notes (Signed)
Cardiology Office Note:    Date:  05/03/2021   ID:  Nathaniel Mcgee, DOB 01-Aug-1965, MRN [id number]  PCP:  [name], NP  [facility] Cardiologist:  Nelva Bush, MD  [facility] Electrophysiologist:  None   Referring MD: [name], NP   Chief Complaint: overdue follow-up  History of Present Illness:    Nathaniel Mcgee is a 55 y.o. male with a hx of paroxysmal Afib, HTN, atypical chest pain with normal coronaries by cardiac CTA, claustrophobia, GERD, morbid obestiy who presents for follow-up.  He was diagnosed with atrial fibrillation October 2017. He has been maintaining sinus rhythm He is not anticoagulated in the setting of a CHA2DS2VASc of 1. He also underwent stress testing for atypical chest pain in October 2017. He showed poor exercise tolerance with upsloping ST segment changes. This was followed by cardiac CT angiography which showed normal coronary arteries with a calcium score is 0.   He was last seen 06/2018 and was feeling well, better since discontinuing metoprolol. He still had occasional palpitations and was started on diltiazem. CHADSVASC of 1, on low dose Aspirin.   Today, the patient reports chest pain. Started when he was sitting in a chair reading. Has a pinched nerve in the shoulder and can make the left arm go numb. Could have been this. Diagnosed with B12 deficiency, has been increasing B12. Chest pain started 2 months ago. Had 2-3 episodes. Left sided pressure. Not worse with exertion. Nothing made it worse or better. Last 45 minutes to 2 hours. Has GERD, takes PPI. Last pain episode was 2-3 weeks. He wants to lose weight and start exercising again.   Past Medical History:  Diagnosis Date   Anemia    Atypical chest pain    a. In setting of Afib in the past;  b. 03/2016 ETT: poor ex tolerance w/ hypertensive response and 76mm upslopting inflat ST dep;  c. 05/2016 Cardiac CTA: Ca2+ score = 0. Nl right dominant cors.   Claustrophobia    Complication of  anesthesia    Pt states with Colonoscopy that he was told he probably has sleep apnea and that he stopped breathing during colonoscopy-Pt is very claustrophobic and states that he has a panic attack anytime they put mask on    Dysrhythmia    A-fib-Has not seen Dr End (cardiologist) since 2020   GERD (gastroesophageal reflux disease)    History of kidney stones    Hypertension    Morbid obesity ([name])    PAF (paroxysmal atrial fibrillation) ([place]) 04/01/2016   a. 03/2016 Afib dx-->echo: EF 50-55%, no rwma;  b. CHA2DSVASc = 1-->ASA only.   Panic attacks    Panic attacks    Snores    a. Had sleep study but had trouble sleeping.  Doesn't think he'd tolerate CPAP/nasal pillow on face anyway.    Past Surgical History:  Procedure Laterality Date   COLONOSCOPY WITH PROPOFOL N/A 09/12/2018   Procedure: COLONOSCOPY WITH PROPOFOL;  Surgeon: Toledo, Benay Pike, MD;  Location: [facility] ENDOSCOPY;  Service: Gastroenterology;  Laterality: N/A;   CYSTOSCOPY/URETEROSCOPY/HOLMIUM LASER/STENT PLACEMENT Left 11/06/2020   Procedure: CYSTOSCOPY/URETEROSCOPY/HOLMIUM LASER/STENT PLACEMENT;  Surgeon: [name], MD;  Location: [facility] ORS;  Service: Urology;  Laterality: Left;   ESOPHAGOGASTRODUODENOSCOPY     ESOPHAGOGASTRODUODENOSCOPY (EGD) WITH PROPOFOL N/A 09/12/2018   Procedure: ESOPHAGOGASTRODUODENOSCOPY (EGD) WITH PROPOFOL;  Surgeon: Toledo, Benay Pike, MD;  Location: [facility] ENDOSCOPY;  Service: Gastroenterology;  Laterality: N/A;   KNEE ARTHROSCOPY  1998  right knee   NASAL SINUS SURGERY  1996    Current Medications: Current Meds  Medication Sig   cetirizine (ZYRTEC) 10 MG tablet Take 10 mg by mouth daily as needed for allergies.   ibuprofen (ADVIL) 200 MG tablet Take 600-800 mg by mouth every 8 (eight) hours as needed for moderate pain.   pantoprazole (PROTONIX) 40 MG tablet Take 40 mg by mouth daily as needed (acid reflux).   sildenafil (REVATIO) 20 MG tablet Take 1-5 tablets (20-100 mg total) by mouth as  needed.     Allergies:   Sulfa antibiotics   Social History   Socioeconomic History   Marital status: Single    Spouse name: Not on file   Number of children: Not on file   Years of education: Not on file   Highest education level: Not on file  Occupational History   Not on file  Tobacco Use   Smoking status: Former    Packs/day: 0.50    Years: 5.00    Pack years: 2.50    Types: Cigarettes    Quit date: 76    Years since quitting: 35.8   Smokeless tobacco: Current    Types: Snuff  Vaping Use   Vaping Use: Never used  Substance and Sexual Activity   Alcohol use: No   Drug use: No   Sexual activity: Never  Other Topics Concern   Not on file  Social History Narrative   Not on file   Social Determinants of Health   Financial Resource Strain: Not on file  Food Insecurity: Not on file  Transportation Needs: Not on file  Physical Activity: Not on file  Stress: Not on file  Social Connections: Not on file     Family History: The patient's family history includes Cancer (age of onset: 63) in his father.  ROS:   Please see the history of present illness.     All other systems reviewed and are negative.  EKGs/Labs/Other Studies Reviewed:    The following studies were reviewed today:  ETT 2017 Blood pressure demonstrated a hypertensive response to exercise. Upsloping ST segment depression ST segment depression of 1 mm was noted during stress in the II, III, aVF, V4, V5 and V6 leads. No T wave inversion was noted during stress. Low exercise capacity   Echo 2017 Study Conclusions   - Procedure narrative: Transthoracic echocardiography. Image    quality was suboptimal. The study was technically difficult, as a    result of poor acoustic windows and body habitus.  - Left ventricle: The cavity size was normal. Systolic function was    normal. The estimated ejection fraction was in the range of 50%    to 55%. Wall motion was normal; there were no regional wall     motion abnormalities. The study is not technically sufficient to    allow evaluation of LV diastolic function.  - Impressions: Unable to assess for [facility] even with definity but    EF appears low normal   Intermediate probability treadmill stress test for angiographically significant coronary artery disease.  Cardiac CTA 2017   IMPRESSION: 1) Calcium score 0   2) Normal right dominant coronary arteries   Jenkins Rouge     Electronically Signed   By: Jenkins Rouge M.D.   On: 06/21/2016 11:47  EKG:  EKG is  ordered today.  The ekg ordered today demonstrates NSR, 65bpm, no   Recent Labs: 11/06/2020: BUN 9; Creatinine, Ser 1.06; Potassium 3.8; Sodium 139  Recent Lipid Panel    Component Value Date/Time   CHOL 173 06/13/2016 0902   TRIG 88 06/13/2016 0902   HDL 50 06/13/2016 0902   CHOLHDL 3.5 06/13/2016 0902   LDLCALC 105 (H) 06/13/2016 0902     Physical Exam:    VS:  BP 128/88 (BP Location: Left Arm, Patient Position: Sitting, Cuff Size: Normal)   Pulse 65   Ht 6\' 4"  (1.93 m)   Wt (!) 328 lb (148.8 kg)   SpO2 98%   BMI 39.93 kg/m     Wt Readings from Last 3 Encounters:  05/03/21 (!) 328 lb (148.8 kg)  04/07/21 (!) 320 lb (145.2 kg)  11/06/20 (!) 320 lb (145.2 kg)     GEN:  Well nourished, well developed in no acute distress HEENT: Normal NECK: No JVD; No carotid bruits LYMPHATICS: No lymphadenopathy CARDIAC: RRR, no murmurs, rubs, gallops RESPIRATORY:  Clear to auscultation without rales, wheezing or rhonchi  ABDOMEN: Soft, non-tender, non-distended MUSCULOSKELETAL:  No edema; No deformity  SKIN: Warm and dry NEUROLOGIC:  Alert and oriented x 3 PSYCHIATRIC:  Normal affect   ASSESSMENT:    1. Paroxysmal atrial fibrillation (HCC)   2. Atypical chest pain   3. Essential hypertension   4. Sleep disorder   5. Morbid obesity ([place])    PLAN:    In order of problems listed above:  Atypical chest pain Reports atypical chest pain that started 2 months  ago. Left sided chest pressure, occurs only at rest. Was diagnosed with B12 def and has not had repeat chest pain since starting B12. Also has GERD. Prior Cardiac CTA in 2017 with normal coronaries, calcium score of 0. EKG today with no ischemic changes. Discussed possibility of stress test, however after discussion, decided to defer stress test and re-evaluate symptoms in a month.   Paroxysmal Afib EKG today shows SR. Not on anticoagulation for CHADSVASC of 1.   HTN BP good today. Not on antihypertensives at baseline.   Suspected OSA Patient with suspected OSA, however he has never been formally tested. He will not get tested since he has claustrophobia and would not use CPAP anyways.   Morbid obesity Wanting to lose weight with diet and exercise.   Disposition: Follow up in 1 month(s) with MD/APP    Signed, Tamieka Rancourt Ninfa Meeker, PA-C  05/03/2021 2:32 PM    [facility]

## 2021-05-27 ENCOUNTER — Ambulatory Visit: Payer: Managed Care, Other (non HMO)

## 2021-05-28 ENCOUNTER — Ambulatory Visit: Payer: Managed Care, Other (non HMO) | Admitting: Internal Medicine

## 2021-06-04 ENCOUNTER — Ambulatory Visit
Admission: RE | Admit: 2021-06-04 | Discharge: 2021-06-04 | Disposition: A | Discharge status: Home / Self Care | Payer: Managed Care, Other (non HMO) | Source: Ambulatory Visit | Attending: Unknown Physician Specialty | Admitting: Unknown Physician Specialty

## 2021-06-04 ENCOUNTER — Other Ambulatory Visit: Payer: Self-pay

## 2021-06-04 DIAGNOSIS — R1310 Dysphagia, unspecified: Secondary | ICD-10-CM | POA: Insufficient documentation

## 2021-06-04 NOTE — Progress Notes (Signed)
Modified Barium Swallow Progress Note  Patient Details  Name: Nathaniel Mcgee MRN: [id number] Date of Birth: Oct 24, 1965  Today's Date: 06/04/2021  Modified Barium Swallow completed.  Full report located under Chart Review in the Imaging Section.  Brief recommendations include the following:  Clinical Impression  Pt demonstrates adequate oropharyngeal abilities with no penetration or aspiration observed throughout the study. Pt has s/s that are suspicious for unmanaged reflux: mildly delayed swallow initiation, habitual throat clearing, chronic globus sensation and noncompliance with reflux medicine. Recommend pt follow up with GI.   Swallow Evaluation Recommendations   Recommended Consults: Consider GI evaluation   SLP Diet Recommendations: Regular solids;Thin liquid   Liquid Administration via: Straw;Cup   Medication Administration: Whole meds with liquid   Supervision: Patient able to self feed   Compensations: Minimize environmental distractions;Slow rate;Small sips/bites   Postural Changes: Seated upright at 90 degrees;Remain semi-upright after after feeds/meals (Comment)   Oral Care Recommendations: Oral care BID      Blayze Haen B. Rutherford Nail M.S., CCC-SLP, Esto Pathologist Rehabilitation Services Office [phone]   [name] 06/04/2021,1:43 PM

## 2021-06-07 ENCOUNTER — Ambulatory Visit: Payer: Managed Care, Other (non HMO) | Admitting: Medical

## 2021-07-21 ENCOUNTER — Ambulatory Visit: Payer: Managed Care, Other (non HMO) | Admitting: Urology

## 2021-07-21 ENCOUNTER — Ambulatory Visit: Payer: Managed Care, Other (non HMO) | Admitting: Podiatry

## 2021-07-21 ENCOUNTER — Other Ambulatory Visit: Payer: Self-pay

## 2021-07-21 DIAGNOSIS — M25372 Other instability, left ankle: Secondary | ICD-10-CM

## 2021-07-21 DIAGNOSIS — S86312D Strain of muscle(s) and tendon(s) of peroneal muscle group at lower leg level, left leg, subsequent encounter: Secondary | ICD-10-CM | POA: Diagnosis not present

## 2021-07-21 NOTE — Progress Notes (Signed)
°  Subjective:  Patient ID: Nathaniel Mcgee, male    DOB: 07/02/1965,  MRN: [id number]  Chief Complaint  Patient presents with   Tendonitis    peroneal tendinitis, 3 month follow up    56 y.o. male returns for follow-up with the above complaint. History confirmed with patient.  Says it is changes more of an ache now that is sharp pain  Objective:  Physical Exam: warm, good capillary refill, no trophic changes or ulcerative lesions, normal DP and PT pulses and normal sensory exam. Left Foot: Mild pain over ATFL and peroneal tendons and with resisted eversion   Radiographs: X-ray of the left foot: no fracture, dislocation, swelling or degenerative changes noted  IMPRESSION: Tendinosis and tenosynovitis of the peroneal longus and brevis tendons, with evidence of inframalleolar peroneus brevis split tear. There is continuity distally seen at the base of the fifth metatarsal.   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 The calcaneofibular ligament appears intact.   Assessment:   1. Tear of peroneal tendon, left, subsequent encounter   2. Left ankle instability        Plan:  Patient was evaluated and treated and all questions answered.  This point I think he is likely maximized his nonoperative treatment.  Advised him on continuing the brace as needed especially when on unstable ground.  Also think he would benefit from hightop boots or shoes such as a hiking boot to stabilize the ankle as well.  We also discussed the option of a custom molded orthoses to post the lateral side of the foot to keep it from excessive inversion.  He will consider this.  He will return to see me as needed if it does not improve and is bothersome and not tolerable or if it worsens at any point with reinjury.  Return if symptoms worsen or fail to improve.

## 2021-08-11 ENCOUNTER — Ambulatory Visit: Payer: Managed Care, Other (non HMO) | Admitting: Urology

## 2021-08-25 ENCOUNTER — Other Ambulatory Visit: Payer: Self-pay

## 2021-08-25 ENCOUNTER — Encounter: Payer: Self-pay | Admitting: Urology

## 2021-08-25 ENCOUNTER — Ambulatory Visit (INDEPENDENT_AMBULATORY_CARE_PROVIDER_SITE_OTHER): Payer: Managed Care, Other (non HMO) | Admitting: Urology

## 2021-08-25 VITALS — BP 146/86 | HR 76 | Ht 76.0 in | Wt 328.0 lb

## 2021-08-25 DIAGNOSIS — N529 Male erectile dysfunction, unspecified: Secondary | ICD-10-CM

## 2021-08-25 DIAGNOSIS — N401 Enlarged prostate with lower urinary tract symptoms: Secondary | ICD-10-CM

## 2021-08-25 DIAGNOSIS — N138 Other obstructive and reflux uropathy: Secondary | ICD-10-CM

## 2021-08-25 DIAGNOSIS — R300 Dysuria: Secondary | ICD-10-CM | POA: Diagnosis not present

## 2021-08-25 LAB — BLADDER SCAN AMB NON-IMAGING

## 2021-08-25 MED ORDER — TADALAFIL 10 MG PO TABS: 10.0000 mg | ORAL_TABLET | Freq: Every day | ORAL | 6 refills | Status: DC | PRN

## 2021-08-25 NOTE — Progress Notes (Signed)
? ?  08/25/2021 ?2:36 PM  ? ?[name] ?Nov 16, 1965 ?[id number] ? ?Reason for visit: Urinary symptoms, ED, history nephrolithiasis ? ?HPI: ?56 year old male with morbid obesity who previously underwent left ureteroscopy and laser lithotripsy with me in May 2022 for a left proximal ureteral stone.  Prostate was moderate in size at that time with lateral lobe hypertrophy, but no median lobe, no bladder abnormalities.  Prostate measured 46 g on CT. ?  ?At our visit in October 2022 he reported some weakening urinary stream and urinary dribbling over the last few months, as well as nocturia 1-2 times overnight, and some urgency.  Bladder scan was normal at 60 mL, urinalysis was completely benign, and he opted for trial of Flomax.  He did not notice any significant improvement on the Flomax, and had bothersome retrograde ejaculation and discontinued this medication after a few weeks.  His symptoms have gradually improved over the last few months, and primary issue now is just some mild urgency.  He is not having any urge incontinence, and back to his baseline nocturia of 0-1 time per night.  He drinks primarily water during the day.  We discussed options including trial of an OAB medication, behavioral strategies including weight loss, or cystoscopy for evaluation of stricture.  He opts for weight loss and observation. ? ?He also was having some problems with erections at that time, and opted for a trial of sildenafil 20 to 100 mg on demand.  He noted significant improvement in the erections with the 60 mg sildenafil dose, but was having extremely bothersome headache with that medication.  I offered him a trial of Cialis 10 to 20 mg on demand to see if side effect profile is lower. ? ?Trial of Cialis 10 to 20 mg on demand for ED ?Behavioral strategies discussed regarding urinary symptoms ?RTC 6 months UA and PVR ? ?[name], MD ? ?[facility] ?[address] ?[place], [place]  [address] ?[phone] ? ?

## 2021-09-02 ENCOUNTER — Other Ambulatory Visit: Payer: Self-pay

## 2021-09-02 ENCOUNTER — Encounter: Payer: Self-pay | Admitting: Podiatry

## 2021-09-02 ENCOUNTER — Ambulatory Visit (INDEPENDENT_AMBULATORY_CARE_PROVIDER_SITE_OTHER): Payer: Managed Care, Other (non HMO)

## 2021-09-02 ENCOUNTER — Ambulatory Visit: Payer: Managed Care, Other (non HMO) | Admitting: Podiatry

## 2021-09-02 DIAGNOSIS — S99922A Unspecified injury of left foot, initial encounter: Secondary | ICD-10-CM

## 2021-09-02 DIAGNOSIS — S99912A Unspecified injury of left ankle, initial encounter: Secondary | ICD-10-CM

## 2021-09-02 DIAGNOSIS — M7672 Peroneal tendinitis, left leg: Secondary | ICD-10-CM | POA: Diagnosis not present

## 2021-09-02 NOTE — Progress Notes (Signed)
?Subjective:  ?Patient ID: Nathaniel Mcgee, male    DOB: December 27, 1965,  MRN: [id number] ? ?Chief Complaint  ?Patient presents with  ? Ankle Injury  ? ? ?56 y.o. male presents with the above complaint.  Patient presents with complaint of left lateral foot pain.  Patient states that is painful to touch painful to walk on.  He states that he has seen Dr. Lynnell Catalan for peroneal tendon tear chronic.  He had an MRI done which showed the tearing.  However last night he heard a pop and crack when he went to get up on his foot and has been painful since then.  He states the pain has started going down a little bit to.  He has not put himself in a boot.  He used ice which helped some.  He wanted to get it evaluated.  He wants to make sure nothing is broken. ? ? ?Review of Systems: Negative except as noted in the HPI. Denies N/V/F/Ch. ? ?Past Medical History:  ?Diagnosis Date  ? Anemia   ? Atypical chest pain   ? a. In setting of Afib in the past;  b. 03/2016 ETT: poor ex tolerance w/ hypertensive response and 22m upslopting inflat ST dep;  c. 05/2016 Cardiac CTA: Ca2+ score = 0. Nl right dominant cors.  ? Claustrophobia   ? Complication of anesthesia   ? Pt states with Colonoscopy that he was told he probably has sleep apnea and that he stopped breathing during colonoscopy-Pt is very claustrophobic and states that he has a panic attack anytime they put mask on   ? Dysrhythmia   ? A-fib-Has not seen Dr End (cardiologist) since 2020  ? GERD (gastroesophageal reflux disease)   ? History of kidney stones   ? Hypertension   ? Morbid obesity ([place]   ? PAF (paroxysmal atrial fibrillation) ([place] 04/01/2016  ? a. 03/2016 Afib dx-->echo: EF 50-55%, no rwma;  b. CHA2DSVASc = 1-->ASA only.  ? Panic attacks   ? Panic attacks   ? Snores   ? a. Had sleep study but had trouble sleeping.  Doesn't think he'd tolerate CPAP/nasal pillow on face anyway.  ? ? ?Current Outpatient Medications:  ?  cetirizine (ZYRTEC) 10 MG tablet, Take 10 mg by mouth  daily as needed for allergies., Disp: , Rfl:  ?  ibuprofen (ADVIL) 200 MG tablet, Take 600-800 mg by mouth every 8 (eight) hours as needed for moderate pain., Disp: , Rfl:  ?  pantoprazole (PROTONIX) 40 MG tablet, Take 40 mg by mouth daily as needed (acid reflux)., Disp: , Rfl:  ?  tadalafil (CIALIS) 10 MG tablet, Take 1 tablet (10 mg total) by mouth daily as needed for erectile dysfunction., Disp: 30 tablet, Rfl: 6 ? ?Social History  ? ?Tobacco Use  ?Smoking Status Former  ? Packs/day: 0.50  ? Years: 5.00  ? Pack years: 2.50  ? Types: Cigarettes  ? Quit date: 184 ? Years since quitting: 36.2  ? Passive exposure: Past  ?Smokeless Tobacco Current  ? Types: Snuff  ? ? ?Allergies  ?Allergen Reactions  ? Sulfa Antibiotics Other (See Comments)  ?  "feels like my bones were burning"  ? ?Objective:  ?There were no vitals filed for this visit. ?There is no height or weight on file to calculate BMI. ?Constitutional Well developed. ?Well nourished.  ?Vascular Dorsalis pedis pulses palpable bilaterally. ?Posterior tibial pulses palpable bilaterally. ?Capillary refill normal to all digits.  ?No cyanosis or clubbing noted. ?Pedal hair growth  normal.  ?Neurologic Normal speech. ?Oriented to person, place, and time. ?Epicritic sensation to light touch grossly present bilaterally.  ?Dermatologic Nails well groomed and normal in appearance. ?No open wounds. ?No skin lesions.  ?Orthopedic: Pain on palpation along the course of the peroneal tendon including the insertion.  Pain with dorsiflexion eversion of the foot resisted.  No pain with plantarflexion inversion of the foot resisted.  No pain at the Achilles tendon, posterior tibial tendon, ATFL ligament.  Swelling noted circumferential around the ankle joint.  No ecchymosis or bruising noted  ? ?Radiographs: 3 views of skeletally mature adult left ankle: No osseous fractures noted no stress fracture noted./Foot.  Pes cavus foot structure noted.  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 ?Assessment:  ? ?1. Left ankle injury, initial encounter   ?2. Foot injury, left, initial encounter   ?3. Peroneal tendinitis of left lower leg   ? ?Plan:  ?Patient was evaluated and treated and all questions answered. ? 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  He may have likely aggravated the peroneal tendons that were inflamed with Dr. Sherryle Lis.  I discussed with him the importance of the immobilization.  He states understanding. ?-He already has a boot at home asked him to place himself in it for next 4 weeks and he can come see me or Dr. Sherryle Lis for follow-up visit.  He states understanding ? ?No follow-ups on file.  ?

## 2021-09-30 ENCOUNTER — Ambulatory Visit: Payer: Managed Care, Other (non HMO) | Admitting: Podiatry

## 2021-09-30 DIAGNOSIS — S99912A Unspecified injury of left ankle, initial encounter: Secondary | ICD-10-CM

## 2021-09-30 DIAGNOSIS — S99922A Unspecified injury of left foot, initial encounter: Secondary | ICD-10-CM

## 2021-10-07 NOTE — Progress Notes (Signed)
?Subjective:  ?Patient ID: Nathaniel Mcgee, male    DOB: 17-Oct-1965,  MRN: [id number] ? ?Chief Complaint  ?Patient presents with  ? Foot Pain  ? ? ?56 y.o. male presents with the above complaint.  Patient presents for follow-up to left peroneal tendinitis.  He states that he is doing a lot better the cam boot immobilization helped.  He still has some residual pain but overall much better he does not want any injection he denies any other acute complaints ? ? ?Review of Systems: Negative except as noted in the HPI. Denies N/V/F/Ch. ? ?Past Medical History:  ?Diagnosis Date  ? Anemia   ? Atypical chest pain   ? a. In setting of Afib in the past;  b. 03/2016 ETT: poor ex tolerance w/ hypertensive response and 5m upslopting inflat ST dep;  c. 05/2016 Cardiac CTA: Ca2+ score = 0. Nl right dominant cors.  ? Claustrophobia   ? Complication of anesthesia   ? Pt states with Colonoscopy that he was told he probably has sleep apnea and that he stopped breathing during colonoscopy-Pt is very claustrophobic and states that he has a panic attack anytime they put mask on   ? Dysrhythmia   ? A-fib-Has not seen Dr End (cardiologist) since 2020  ? GERD (gastroesophageal reflux disease)   ? History of kidney stones   ? Hypertension   ? Morbid obesity ([facility]   ? PAF (paroxysmal atrial fibrillation) ([facility] 04/01/2016  ? a. 03/2016 Afib dx-->echo: EF 50-55%, no rwma;  b. CHA2DSVASc = 1-->ASA only.  ? Panic attacks   ? Panic attacks   ? Snores   ? a. Had sleep study but had trouble sleeping.  Doesn't think he'd tolerate CPAP/nasal pillow on face anyway.  ? ? ?Current Outpatient Medications:  ?  cetirizine (ZYRTEC) 10 MG tablet, Take 10 mg by mouth daily as needed for allergies., Disp: , Rfl:  ?  ibuprofen (ADVIL) 200 MG tablet, Take 600-800 mg by mouth every 8 (eight) hours as needed for moderate pain., Disp: , Rfl:  ?  pantoprazole (PROTONIX) 40 MG tablet, Take 40 mg by mouth daily as needed (acid reflux)., Disp: , Rfl:  ?  tadalafil  (CIALIS) 10 MG tablet, Take 1 tablet (10 mg total) by mouth daily as needed for erectile dysfunction., Disp: 30 tablet, Rfl: 6 ? ?Social History  ? ?Tobacco Use  ?Smoking Status Former  ? Packs/day: 0.50  ? Years: 5.00  ? Pack years: 2.50  ? Types: Cigarettes  ? Quit date: 175 ? Years since quitting: 36.3  ? Passive exposure: Past  ?Smokeless Tobacco Current  ? Types: Snuff  ? ? ?Allergies  ?Allergen Reactions  ? Sulfa Antibiotics Other (See Comments)  ?  "feels like my bones were burning"  ? ?Objective:  ?There were no vitals filed for this visit. ?There is no height or weight on file to calculate BMI. ?Constitutional Well developed. ?Well nourished.  ?Vascular Dorsalis pedis pulses palpable bilaterally. ?Posterior tibial pulses palpable bilaterally. ?Capillary refill normal to all digits.  ?No cyanosis or clubbing noted. ?Pedal hair growth normal.  ?Neurologic Normal speech. ?Oriented to person, place, and time. ?Epicritic sensation to light touch grossly present bilaterally.  ?Dermatologic Nails well groomed and normal in appearance. ?No open wounds. ?No skin lesions.  ?Orthopedic: No further pain on palpation along the course of the peroneal tendon including the insertion.  No further pain with dorsiflexion eversion of the foot resisted.  No pain with plantarflexion inversion of  the foot resisted.  No pain at the Achilles tendon, posterior tibial tendon, ATFL ligament.  Swelling noted circumferential around the ankle joint.  No ecchymosis or bruising noted  ? ?Radiographs: 3 views of skeletally mature adult left ankle: No osseous fractures noted no stress fracture noted./Foot.  Pes cavus foot structure noted.  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 ?Assessment:  ? ?1. Left ankle injury, initial encounter   ?2. Foot injury, left, initial encounter   ? ? ?Plan:  ?Patient was evaluated and treated and all questions answered. ? 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  At this time he states he is doing okay.  The brace does help.  He will states there is some discomfort with a lot of pressure but overall has been able to return to regular activity without any restrictions.  I once again discussed shoe gear modification orthotics and insoles he states understanding. ? ?No follow-ups on file.  ?

## 2022-03-02 ENCOUNTER — Ambulatory Visit: Payer: Managed Care, Other (non HMO) | Admitting: Urology

## 2022-03-09 ENCOUNTER — Ambulatory Visit: Payer: Managed Care, Other (non HMO) | Admitting: Urology

## 2022-03-29 ENCOUNTER — Other Ambulatory Visit
Admission: RE | Admit: 2022-03-29 | Discharge: 2022-03-29 | Disposition: A | Discharge status: Home / Self Care | Payer: Managed Care, Other (non HMO) | Attending: Urology | Admitting: Urology

## 2022-03-29 ENCOUNTER — Other Ambulatory Visit: Payer: Self-pay | Admitting: *Deleted

## 2022-03-29 ENCOUNTER — Ambulatory Visit: Payer: Managed Care, Other (non HMO) | Admitting: Urology

## 2022-03-29 ENCOUNTER — Encounter: Payer: Self-pay | Admitting: Urology

## 2022-03-29 VITALS — BP 143/83 | HR 76 | Ht 77.0 in | Wt 336.0 lb

## 2022-03-29 DIAGNOSIS — N401 Enlarged prostate with lower urinary tract symptoms: Secondary | ICD-10-CM

## 2022-03-29 DIAGNOSIS — R300 Dysuria: Secondary | ICD-10-CM | POA: Insufficient documentation

## 2022-03-29 DIAGNOSIS — N529 Male erectile dysfunction, unspecified: Secondary | ICD-10-CM

## 2022-03-29 DIAGNOSIS — R351 Nocturia: Secondary | ICD-10-CM | POA: Diagnosis not present

## 2022-03-29 DIAGNOSIS — N200 Calculus of kidney: Secondary | ICD-10-CM

## 2022-03-29 DIAGNOSIS — N3281 Overactive bladder: Secondary | ICD-10-CM

## 2022-03-29 DIAGNOSIS — N138 Other obstructive and reflux uropathy: Secondary | ICD-10-CM

## 2022-03-29 DIAGNOSIS — Z87442 Personal history of urinary calculi: Secondary | ICD-10-CM

## 2022-03-29 LAB — BLADDER SCAN AMB NON-IMAGING

## 2022-03-29 MED ORDER — TADALAFIL 10 MG PO TABS: 10.0000 mg | ORAL_TABLET | Freq: Every day | ORAL | 11 refills | Status: AC | PRN

## 2022-03-29 NOTE — Addendum Note (Signed)
Addended by: Despina Hidden on: 03/29/2022 03:38 PM   Modules accepted: Orders

## 2022-03-29 NOTE — Progress Notes (Signed)
   03/29/2022 3:13 PM   Nathaniel Mcgee Jan 03, 1966 [id number]  Reason for visit: Urinary symptoms, ED, history nephrolithiasis  HPI: 56 year old male with morbid obesity(BMI 40) who previously underwent left ureteroscopy and laser lithotripsy with me in May 2022 for a left proximal ureteral stone.  Prostate was moderate in size at that time with lateral lobe hypertrophy, but no median lobe, no bladder abnormalities.  Prostate measured 46 g on CT.   At our visit in October 2022 he had had some weakened urinary stream and nocturia 1-2 times overnight with some occasional urgency.  Urinalysis and bladder scan was normal.  He opted for trial of Flomax which did not improve his urinary symptoms, and he discontinued secondary to bothersome retrograde ejaculation.  At follow-up visit in March 2023 his symptoms had improved, and his only issue was some mild urgency.  He also was having some problems with erections previously, and was having bothersome headache with sildenafil use.  We opted for a trial of Cialis 10 to 20 mg on demand.  He feels the Cialis has helped with erections and he does not have the bothersome headache that he had with sildenafil.  He continues to report some occasional urgency during the day, as well as some nocturia twice at night.  He thinks that if he sleeps well he does not have problems with nocturia.  He thinks he probably has sleep apnea, but was unable to complete the test.  He has some weak urinary stream overnight and in the morning.  He does drink water overnight secondary to dry mouth.  PVR today is normal at 0 mL, he was unable to void for urinalysis.  We again reviewed behavioral strategies including weight loss, avoiding bladder irritants, timed voiding, and being evaluated for sleep apnea.  We also discussed possibility of urethral stricture, and consideration of cystoscopy, but he would like to hold off for now.  -Continue Cialis, refilled and okay to increase to 20 mg  as needed -Behavioral strategies discussed regarding urinary symptoms -Again strongly recommended sleep apnea evaluation -Consider cystoscopy in the future to rule out urethral stricture if worsening symptoms -RTC 1 year PVR  [name], MD  [facility] [address], [place], [place] [address] [phone]

## 2022-03-29 NOTE — Patient Instructions (Signed)
Sleep Apnea Sleep apnea is a condition in which breathing pauses or becomes shallow during sleep. People with sleep apnea usually snore loudly. They may have times when they gasp and stop breathing for 10 seconds or more during sleep. This may happen many times during the night. Sleep apnea disrupts your sleep and keeps your body from getting the rest that it needs. This condition can increase your risk of certain health problems, including: Heart attack. Stroke. Obesity. Type 2 diabetes. Heart failure. Irregular heartbeat. High blood pressure. The goal of treatment is to help you breathe normally again. What are the causes?  The most common cause of sleep apnea is a collapsed or blocked airway. There are three kinds of sleep apnea: Obstructive sleep apnea. This kind is caused by a blocked or collapsed airway. Central sleep apnea. This kind happens when the part of the brain that controls breathing does not send the correct signals to the muscles that control breathing. Mixed sleep apnea. This is a combination of obstructive and central sleep apnea. What increases the risk? You are more likely to develop this condition if you: Are overweight. Smoke. Have a smaller than normal airway. Are older. Are male. Drink alcohol. Take sedatives or tranquilizers. Have a family history of sleep apnea. Have a tongue or tonsils that are larger than normal. What are the signs or symptoms? Symptoms of this condition include: Trouble staying asleep. Loud snoring. Morning headaches. Waking up gasping. Dry mouth or sore throat in the morning. Daytime sleepiness and tiredness. If you have daytime fatigue because of sleep apnea, you may be more likely to have: Trouble concentrating. Forgetfulness. Irritability or mood swings. Personality changes. Feelings of depression. Sexual dysfunction. This may include loss of interest if you are male, or erectile dysfunction if you are male. How is this  diagnosed? This condition may be diagnosed with: A medical history. A physical exam. A series of tests that are done while you are sleeping (sleep study). These tests are usually done in a sleep lab, but they may also be done at home. How is this treated? Treatment for this condition aims to restore normal breathing and to ease symptoms during sleep. It may involve managing health issues that can affect breathing, such as high blood pressure or obesity. Treatment may include: Sleeping on your side. Using a decongestant if you have nasal congestion. Avoiding the use of depressants, including alcohol, sedatives, and narcotics. Losing weight if you are overweight. Making changes to your diet. Quitting smoking. Using a device to open your airway while you sleep, such as: An oral appliance. This is a custom-made mouthpiece that shifts your lower jaw forward. A continuous positive airway pressure (CPAP) device. This device blows air through a mask when you breathe out (exhale). A nasal expiratory positive airway pressure (EPAP) device. This device has valves that you put into each nostril. A bi-level positive airway pressure (BIPAP) device. This device blows air through a mask when you breathe in (inhale) and breathe out (exhale). Having surgery if other treatments do not work. During surgery, excess tissue is removed to create a wider airway. Follow these instructions at home: Lifestyle Make any lifestyle changes that your health care provider recommends. Eat a healthy, well-balanced diet. Take steps to lose weight if you are overweight. Avoid using depressants, including alcohol, sedatives, and narcotics. Do not use any products that contain nicotine or tobacco. These products include cigarettes, chewing tobacco, and vaping devices, such as e-cigarettes. If you need help quitting, ask   your health care provider. General instructions Take over-the-counter and prescription medicines only as told  by your health care provider. If you were given a device to open your airway while you sleep, use it only as told by your health care provider. If you are having surgery, make sure to tell your health care provider you have sleep apnea. You may need to bring your device with you. Keep all follow-up visits. This is important. Contact a health care provider if: The device that you received to open your airway during sleep is uncomfortable or does not seem to be working. Your symptoms do not improve. Your symptoms get worse. Get help right away if: You develop: Chest pain. Shortness of breath. Discomfort in your back, arms, or stomach. You have: Trouble speaking. Weakness on one side of your body. Drooping in your face. These symptoms may represent a serious problem that is an emergency. Do not wait to see if the symptoms will go away. Get medical help right away. Call your local emergency services (911 in the [place] Do not drive yourself to the hospital. Summary Sleep apnea is a condition in which breathing pauses or becomes shallow during sleep. The most common cause is a collapsed or blocked airway. The goal of treatment is to restore normal breathing and to ease symptoms during sleep. This information is not intended to replace advice given to you by your health care provider. Make sure you discuss any questions you have with your health care provider. Document Revised: 01/20/2021 Document Reviewed: 05/22/2020 Elsevier Patient Education  [place] With Sleep Apnea Sleep apnea is a condition in which breathing pauses or becomes shallow during sleep. Sleep apnea is most commonly caused by a collapsed or blocked airway. People with sleep apnea usually snore loudly. They may have times when they gasp and stop breathing for 10 seconds or more during sleep. This may happen many times during the night. The breaks in breathing also interrupt the deep sleep that you need to feel  rested. Even if you do not completely wake up from the gaps in breathing, your sleep may not be restful and you feel tired during the day. You may also have a headache in the morning and low energy during the day, and you may feel anxious or depressed. How can sleep apnea affect me? Sleep apnea increases your chances of extreme tiredness during the day (daytime fatigue). It can also increase your risk for health conditions, such as: Heart attack. Stroke. Obesity. Type 2 diabetes. Heart failure. Irregular heartbeat. High blood pressure. If you have daytime fatigue as a result of sleep apnea, you may be more likely to: Perform poorly at school or work. Fall asleep while driving. Have difficulty with attention. Develop depression or anxiety. Have sexual dysfunction. What actions can I take to manage sleep apnea? Sleep apnea treatment  If you were given a device to open your airway while you sleep, use it only as told by your health care provider. You may be given: An oral appliance. This is a custom-made mouthpiece that shifts your lower jaw forward. A continuous positive airway pressure (CPAP) device. This device blows air through a mask when you breathe out (exhale). A nasal expiratory positive airway pressure (EPAP) device. This device has valves that you put into each nostril. A bi-level positive airway pressure (BIPAP) device. This device blows air through a mask when you breathe in (inhale) and breathe out (exhale). You may need surgery if other treatments  do not work for you. Sleep habits Go to sleep and wake up at the same time every day. This helps set your internal clock (circadian rhythm) for sleeping. If you stay up later than usual, such as on weekends, try to get up in the morning within 2 hours of your normal wake time. Try to get at least 7-9 hours of sleep each night. Stop using a computer, tablet, and mobile phone a few hours before bedtime. Do not take long naps during  the day. If you nap, limit it to 30 minutes. Have a relaxing bedtime routine. Reading or listening to music may relax you and help you sleep. Use your bedroom only for sleep. Keep your television and computer out of your bedroom. Keep your bedroom cool, dark, and quiet. Use a supportive mattress and pillows. Follow your health care provider's instructions for other changes to sleep habits. Nutrition Do not eat heavy meals in the evening. Do not have caffeine in the later part of the day. The effects of caffeine can last for more than 5 hours. Follow your health care provider's or dietitian's instructions for any diet changes. Lifestyle     Do not drink alcohol before bedtime. Alcohol can cause you to fall asleep at first, but then it can cause you to wake up in the middle of the night and have trouble getting back to sleep. Do not use any products that contain nicotine or tobacco. These products include cigarettes, chewing tobacco, and vaping devices, such as e-cigarettes. If you need help quitting, ask your health care provider. Medicines Take over-the-counter and prescription medicines only as told by your health care provider. Do not use over-the-counter sleep medicine. You can become dependent on this medicine, and it can make sleep apnea worse. Do not use medicines, such as sedatives and narcotics, unless told by your health care provider. Activity Exercise on most days, but avoid exercising in the evening. Exercising near bedtime can interfere with sleeping. If possible, spend time outside every day. Natural light helps regulate your circadian rhythm. General information Lose weight if you need to, and maintain a healthy weight. Keep all follow-up visits. This is important. If you are having surgery, make sure to tell your health care provider that you have sleep apnea. You may need to bring your device with you. Where to find more information Learn more about sleep apnea and  daytime fatigue from: American Sleep Association: sleepassociation.[place]: sleepfoundation.org National Heart, Lung, and Blood Institute: https://www.hartman-hill.biz/ Summary Sleep apnea is a condition in which breathing pauses or becomes shallow during sleep. Sleep apnea can cause daytime fatigue and other serious health conditions. You may need to wear a device while sleeping to help keep your airway open. If you are having surgery, make sure to tell your health care provider that you have sleep apnea. You may need to bring your device with you. Making changes to sleep habits, diet, lifestyle, and activity can help you manage sleep apnea. This information is not intended to replace advice given to you by your health care provider. Make sure you discuss any questions you have with your health care provider. Document Revised: 01/20/2021 Document Reviewed: 05/22/2020 Elsevier Patient Education  [place].

## 2022-06-24 IMAGING — CT CT RENAL STONE PROTOCOL
2 of 4 series · 16 of 46 positions shown, 18 images · non-contrast
Comparison: None.

CLINICAL DATA: Left flank pain since [REDACTED]. No hx of stones or
surgery. Microscopic hematuria.

EXAM:
CT ABDOMEN AND PELVIS WITHOUT CONTRAST
TECHNIQUE: Multidetector CT imaging of the abdomen and pelvis was performed
following the standard protocol without IV contrast.

[Series 2: stone full standard · axial · 0.86mm/px · z∈[-1278,-768]mm · 13 of 112 slices shown, 15 images]
[im 5/112  soft-tissue]
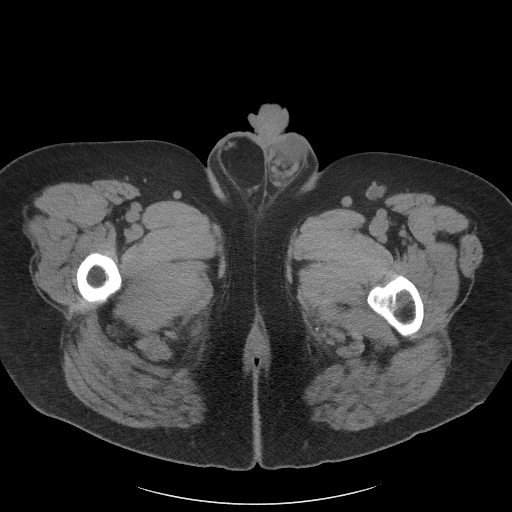
[im 5/112  bone]
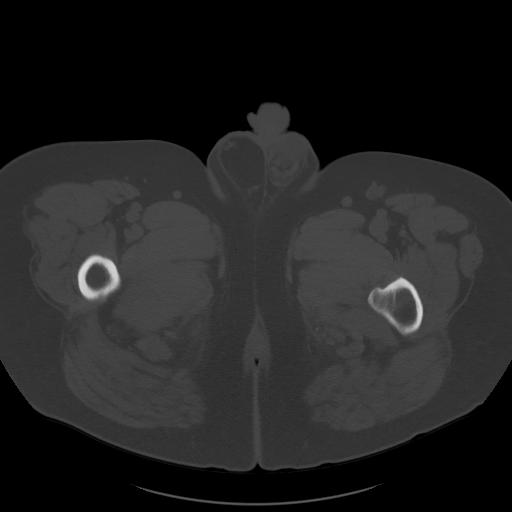
[im 15/112  soft-tissue]
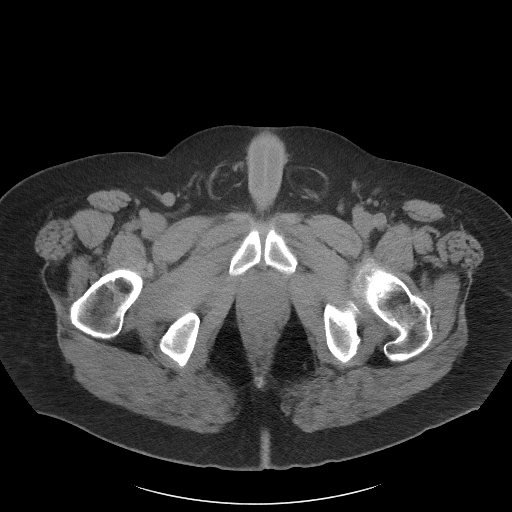
[im 25/112  soft-tissue]
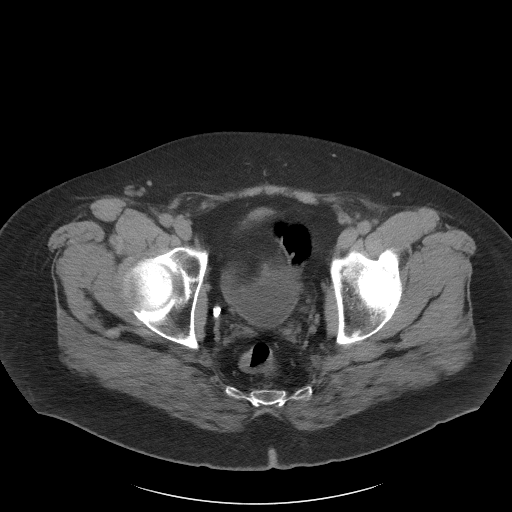
[im 29/112  soft-tissue]
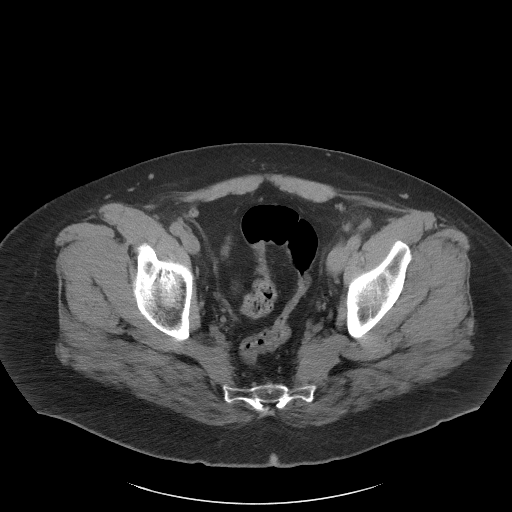
[im 39/112  soft-tissue]
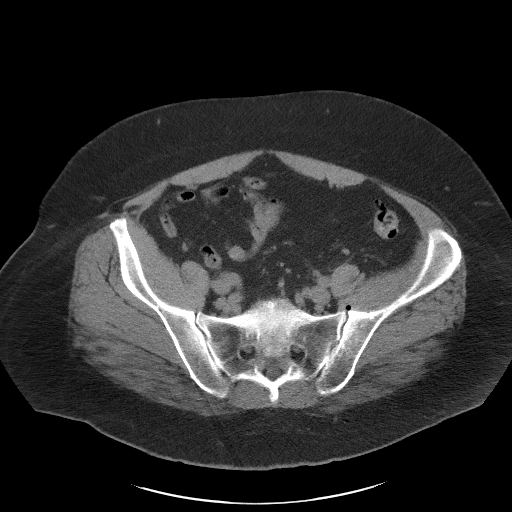
[im 49/112  soft-tissue]
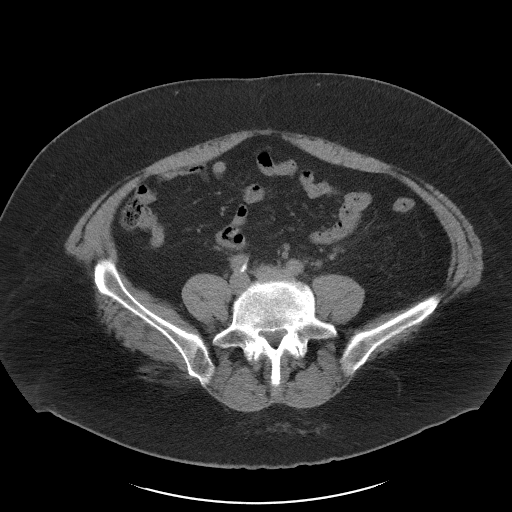
[im 58/112  soft-tissue]
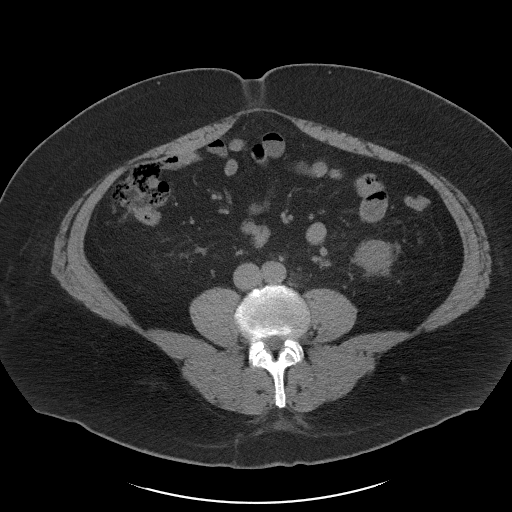
[im 63/112  soft-tissue]
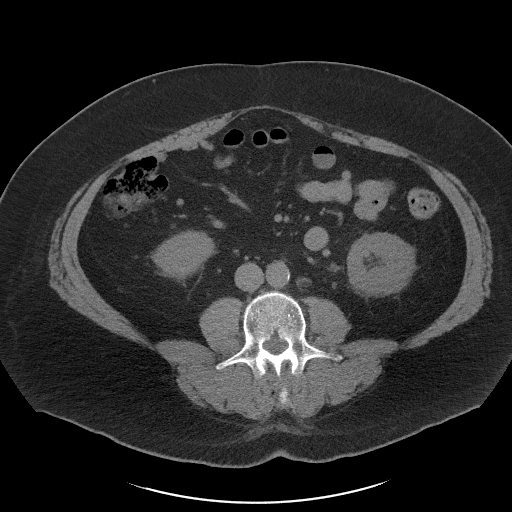
[im 73/112  soft-tissue]
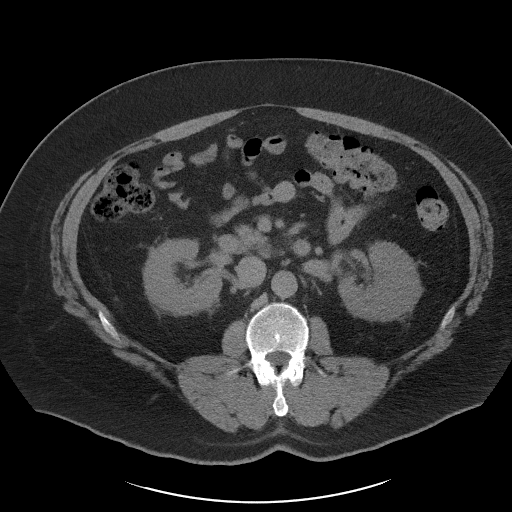
[im 73/112  bone]
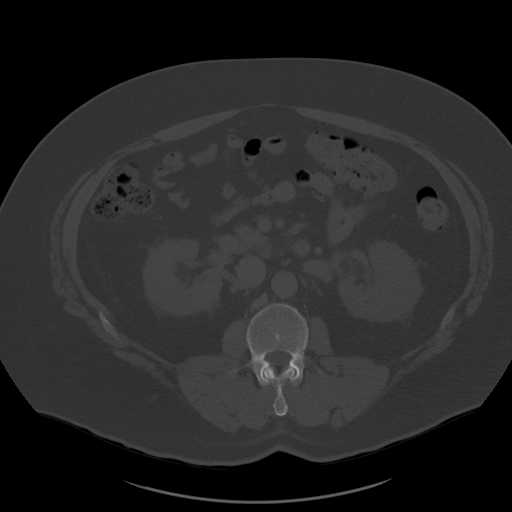
[im 83/112  soft-tissue]
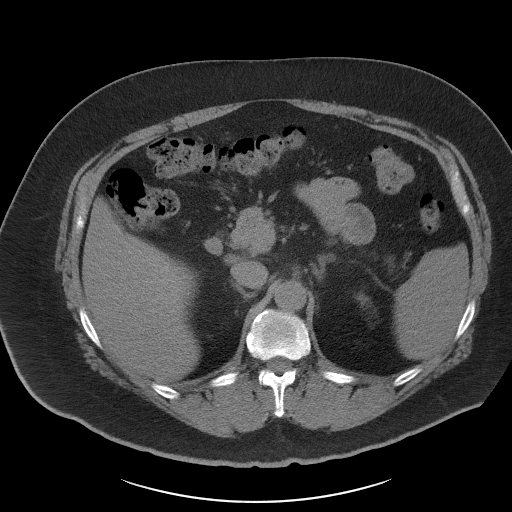
[im 87/112  soft-tissue]
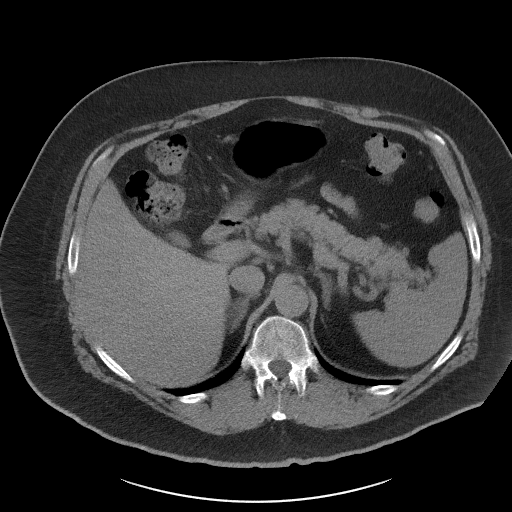
[im 97/112  soft-tissue]
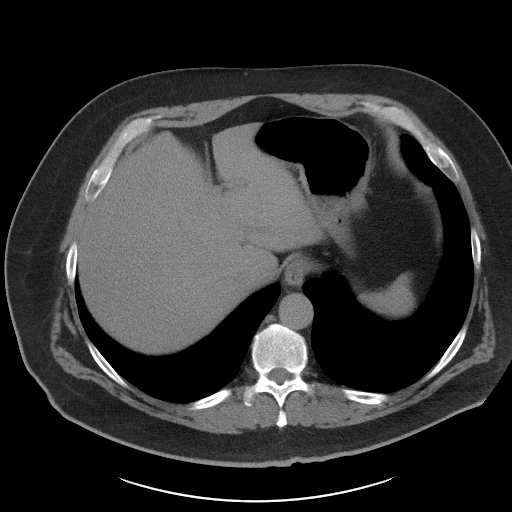
[im 107/112  soft-tissue]
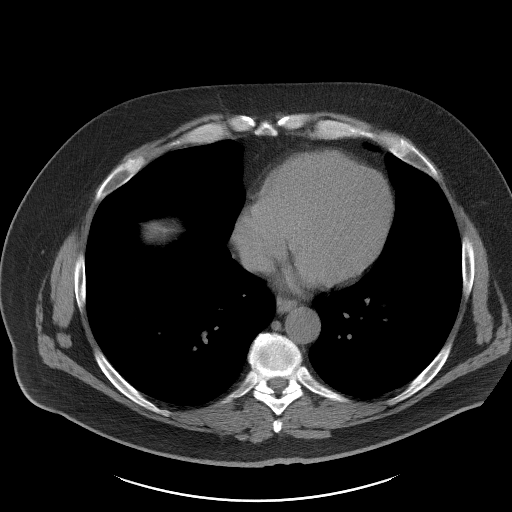

[Series 5: coronal · coronal · 1.00mm/px · 3 of 177 slices shown]
[im 59/177  soft-tissue]
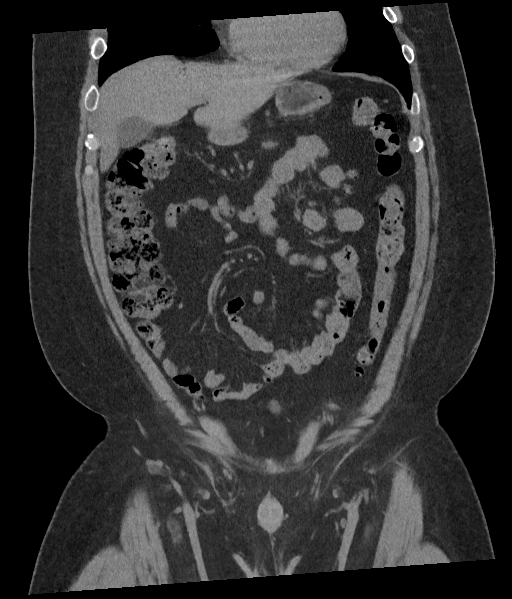
[im 79/177  soft-tissue]
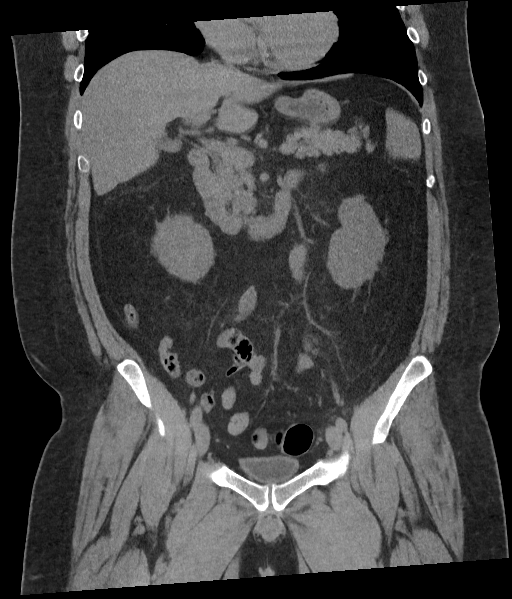
[im 98/177  soft-tissue]
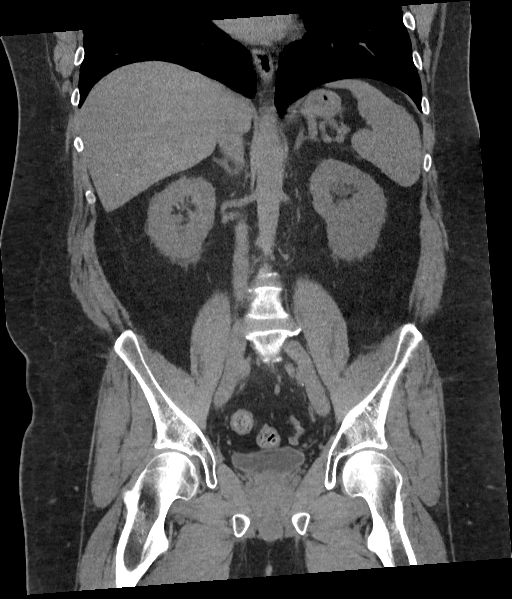

[16 of 46 positions shown; findings below may reference images not displayed]

FINDINGS: Lower chest: No acute abnormality.

Evaluation of the abdominal viscera somewhat limited by the lack of
IV contrast.

Hepatobiliary: No focal liver abnormality is seen. Normal appearance
of the gallbladder.

Pancreas: Unremarkable. No surrounding inflammatory changes.

Spleen: Normal in size without focal abnormality.

Adrenals/Urinary Tract: Adrenal glands are normal. The there is a 4
mm calculus in the superior left ureter (series 2, image 53) with
mild upstream hydronephrosis. There is an additional punctate
calculus in the left kidney. No right renal calculi or
hydronephrosis. Urinary bladder is unremarkable.

Stomach/Bowel: Stomach is within normal limits. Appendix appears
normal. No evidence of bowel wall thickening, distention, or
inflammatory changes. Few colonic diverticula.

Vascular/Lymphatic: Mild aortic atherosclerosis. No enlarged
abdominal or pelvic lymph nodes.

Reproductive: Prostate is unremarkable.

Other: Bilateral fat containing inguinal hernias. Tiny fat
containing umbilical hernia.

Musculoskeletal: No acute or significant osseous findings.
IMPRESSION: 1. There is a 4 mm obstructing calculus in the superior left ureter
with mild upstream hydronephrosis. Additional punctate calculus in
the left kidney.

2.  No right renal calculi or hydronephrosis.

3.  Colonic diverticula without evidence of diverticulitis.

4.  Aortic atherosclerosis.

5. Bilateral fat containing inguinal hernias and tiny umbilical
hernia.

These results will be called to the ordering clinician or
representative by the Radiologist Assistant, and communication
documented in the PACS or [REDACTED].

## 2022-08-16 ENCOUNTER — Ambulatory Visit (INDEPENDENT_AMBULATORY_CARE_PROVIDER_SITE_OTHER): Payer: Managed Care, Other (non HMO)

## 2022-08-16 ENCOUNTER — Ambulatory Visit: Payer: Managed Care, Other (non HMO) | Admitting: Podiatry

## 2022-08-16 DIAGNOSIS — M778 Other enthesopathies, not elsewhere classified: Secondary | ICD-10-CM

## 2022-08-16 MED ORDER — MELOXICAM 15 MG PO TABS: 15.0000 mg | ORAL_TABLET | Freq: Every day | ORAL | 0 refills | Status: AC

## 2022-08-16 NOTE — Patient Instructions (Signed)

## 2022-08-16 NOTE — Progress Notes (Signed)
  Subjective:  Patient ID: Nathaniel Mcgee, male    DOB: [birth year],  MRN: [id number]  Chief Complaint  Patient presents with   Foot Pain    Right heel pain. Started a couple of months ago. Pain is worst in the morning or standing after sitting for a while.     57 y.o. male presents with the above complaint.  Patient presents for pain in the right heel.  Has been going on for about 2 to 3 months.  Pain is worse in the morning when he steps out of bed or after he has been sitting for a while and gets up.  He has pain that is at a stabbing pain in his right heel.   Review of Systems: Negative except as noted in the HPI. Denies N/V/F/Ch.   Objective:  There were no vitals filed for this visit. There is no height or weight on file to calculate BMI. Constitutional Well developed. Well nourished.  Vascular Dorsalis pedis pulses palpable bilaterally. Posterior tibial pulses palpable bilaterally. Capillary refill normal to all digits.  No cyanosis or clubbing noted. Pedal hair growth normal.  Neurologic Normal speech. Oriented to person, place, and time. Epicritic sensation to light touch grossly present bilaterally.  Dermatologic Nails well groomed and normal in appearance. No open wounds. No skin lesions.  Orthopedic: Normal joint ROM without pain or crepitus bilaterally. No visible deformities. Tender to palpation at the calcaneal tuber right. No pain with calcaneal squeeze right. Ankle ROM diminished range of motion right. Silf[place]old Test: negative right.   Radiographs: Taken and reviewed. No acute fractures or dislocations. No evidence of stress fracture.  Plantar heel spur absent. Posterior heel spur absent.   Assessment:   1. Capsulitis of foot, right    Plan:  Patient was evaluated and treated and all questions answered.  Plantar Fasciitis, right - XR reviewed as above.  - Educated on icing and stretching. Instructions given.  - Injection delivered to the plantar  fascia as below. - DME: Power steps dispensed x 3 pairs - Pharmacologic management: Meloxicam 15 mg take once daily for pain and inflammation in the right heel for the next 3 days. Educated on risks/benefits and proper taking of medication.  Procedure: Injection Tendon/Ligament Location: Right plantar fascia at the glabrous junction; medial approach. Skin Prep: alcohol Injectate: 1 cc 0.5% marcaine plain, 1 cc kenalog 10. Disposition: Patient tolerated procedure well. Injection site dressed with a band-aid.  Return in about 4 weeks (around 09/13/2022) for f/u R heel pain PF.

## 2022-09-20 ENCOUNTER — Ambulatory Visit: Payer: Managed Care, Other (non HMO) | Admitting: Podiatry

## 2022-09-20 DIAGNOSIS — M722 Plantar fascial fibromatosis: Secondary | ICD-10-CM

## 2022-09-20 DIAGNOSIS — M7751 Other enthesopathy of right foot: Secondary | ICD-10-CM | POA: Diagnosis not present

## 2022-09-20 NOTE — Progress Notes (Signed)
  Subjective:  Patient ID: Nathaniel Mcgee, male    DOB: 1966-04-04,  MRN: [id number]  Chief Complaint  Patient presents with   Foot Pain    Pt stated that he still has a lot of pain     57 y.o. male presents for follow-up of right heel pain.  He says that after the steroid injection that was given for plantar fascia at his last visit he had seen improvement in 1 area of the bottom of the right heel but he still has pain at the back of the right heel on the bottom.  Thinks that that shot helped 1 area but did not help the other area.  Says his pain is worse more he is on his feet throughout the day.  But also is bad after he gets up after sitting or resting for a while.   Review of Systems: Negative except as noted in the HPI. Denies N/V/F/Ch.   Objective:  There were no vitals filed for this visit. There is no height or weight on file to calculate BMI. Constitutional Well developed. Well nourished.  Vascular Dorsalis pedis pulses palpable bilaterally. Posterior tibial pulses palpable bilaterally. Capillary refill normal to all digits.  No cyanosis or clubbing noted. Pedal hair growth normal.  Neurologic Normal speech. Oriented to person, place, and time. Epicritic sensation to light touch grossly present bilaterally.  Dermatologic Nails well groomed and normal in appearance. No open wounds. No skin lesions.  Orthopedic: Normal joint ROM without pain or crepitus bilaterally. No visible deformities. Tender to palpation at the central plantar aspect of the right calcaneus under the plantar calcaneal bursa decrease pain at the plantar medial tubercle of the right heel. No pain with calcaneal squeeze right. Ankle ROM diminished range of motion right. Silfverskiold Test: negative right.   Radiographs: Taken and reviewed. No acute fractures or dislocations. No evidence of stress fracture.  Plantar heel spur absent. Posterior heel spur absent.   Assessment:   1. Calcaneal bursitis  (heel), right   2. Plantar fasciitis, right     Plan:  Patient was evaluated and treated and all questions answered.  Plantar calcaneal bursitis right, plantar fasciitis improved right - XR reviewed as above.  -Discussed with the patient I believe we have successfully treated his plantar fasciitis pain however he still has plantar calcaneal bursitis pain that is more evident now that he had the previous steroid injection for plantar fascia - Educated on icing and stretching. Instructions given.  - Injection delivered to the plantar calcaneal bursa - DME: Continue power steps - Pharmacologic management: Discontinue meloxicam as did not provide enough benefit -Will place referral for physical therapy to resolve PT and [facility] per patient request  Procedure: Injection plantar calcaneal bursa tendon ligament insertion right heel Location: Right plantar calcaneal bursa at the glabrous junction; medial approach. Skin Prep: alcohol Injectate: 1 cc 0.5% marcaine plain, 1 cc kenalog 10. Disposition: Patient tolerated procedure well. Injection site dressed with a band-aid.  Return in about 6 weeks (around 11/01/2022) for Follow-up right heel pain.

## 2022-11-01 ENCOUNTER — Ambulatory Visit (INDEPENDENT_AMBULATORY_CARE_PROVIDER_SITE_OTHER): Payer: Managed Care, Other (non HMO) | Admitting: Podiatry

## 2022-11-01 DIAGNOSIS — M778 Other enthesopathies, not elsewhere classified: Secondary | ICD-10-CM

## 2022-11-01 DIAGNOSIS — M722 Plantar fascial fibromatosis: Secondary | ICD-10-CM | POA: Diagnosis not present

## 2022-11-01 DIAGNOSIS — M7751 Other enthesopathy of right foot: Secondary | ICD-10-CM

## 2022-11-01 NOTE — Progress Notes (Signed)
  Subjective:  Patient ID: Nathaniel Mcgee, male    DOB: 01/12/1966,  MRN: [id number]  Chief Complaint  Patient presents with   Follow-up    Right heel pain. Pain to heel is better. Patient is complaining of pain to the top lateral aspect of right foot. No known injuries.     57 y.o. male presents for follow-up of right heel pain.  Patient has previously gotten 2 different steroid injections in different locations in the right plantar foot 1 in the arch and 1 on the right heel.  He was also been using power step orthotics.  He is having less pain in the heel at this point but is having new pain that is worse on the top and outside of the right foot.  Says it is worse when he is flexing his foot down and when he walks on uneven surfaces.   Review of Systems: Negative except as noted in the HPI. Denies N/V/F/Ch.   Objective:  There were no vitals filed for this visit. There is no height or weight on file to calculate BMI. Constitutional Well developed. Well nourished.  Vascular Dorsalis pedis pulses palpable bilaterally. Posterior tibial pulses palpable bilaterally. Capillary refill normal to all digits.  No cyanosis or clubbing noted. Pedal hair growth normal.  Neurologic Normal speech. Oriented to person, place, and time. Epicritic sensation to light touch grossly present bilaterally.  Dermatologic Nails well groomed and normal in appearance. No open wounds. No skin lesions.  Orthopedic: Normal joint ROM without pain or crepitus bilaterally. No visible deformities. Tender to palpation at the area about the extensor digitorum brevis and extensor tendons in the right dorsal lateral midfoot.  No pain with palpation of the plantar right heel No pain with calcaneal squeeze right. Ankle ROM diminished range of motion right. Silfverskiold Test: negative right.   Radiographs: Taken and reviewed. No acute fractures or dislocations. No evidence of stress fracture.  Plantar heel spur absent.  Posterior heel spur absent.   Assessment:   1. Extensor tendinitis of foot   2. Plantar fasciitis, right   3. Calcaneal bursitis (heel), right      Plan:  Patient was evaluated and treated and all questions answered.  Plantar calcaneal bursitis right, plantar fasciitis improved right - XR reviewed as above.  -Plantar fasciitis and bursitis pain seems to be resolved after 2 steroid injections -Continue stretching and icing -Continue power step orthotics.  # Extensor tendinitis of right foot -Discussed with patient he has evidence of having extensor tendinitis and possible extensor digitorum brevis inflammation -Recommend treatment with a steroid injection at this visit -Also recommend icing and anti-inflammatory gel and medication such as Voltaren gel or ibuprofen -Injection as well  Procedure: Injection dorsal lateral midfoot extensor tendons  Location: Right dorsal lateral midfoot along the extensor digitorum longus tendons Skin Prep: alcohol Injectate: 1 cc 0.5% marcaine plain, 1 cc kenalog 10. Disposition: Patient tolerated procedure well. Injection site dressed with a band-aid.  Return if symptoms worsen or fail to improve.

## 2023-04-04 ENCOUNTER — Ambulatory Visit: Payer: Managed Care, Other (non HMO) | Admitting: Urology
